# Patient Record
Sex: Female | Born: 1955 | Race: White | Hispanic: No | Marital: Married | Smoking: Former smoker
Health system: Southern US, Community
[De-identification: ages and names within clinical notes are randomized; demographics above are authoritative.]

## PROBLEM LIST (undated history)

## (undated) DIAGNOSIS — F329 Major depressive disorder, single episode, unspecified: Secondary | ICD-10-CM

## (undated) DIAGNOSIS — F32A Depression, unspecified: Secondary | ICD-10-CM

## (undated) HISTORY — PX: ABDOMINAL HYSTERECTOMY: SHX81

## (undated) HISTORY — PX: KNEE SURGERY: SHX244

## (undated) HISTORY — DX: Depression, unspecified: F32.A

## (undated) HISTORY — PX: APPENDECTOMY: SHX54

## (undated) HISTORY — DX: Major depressive disorder, single episode, unspecified: F32.9

## (undated) HISTORY — PX: GASTRIC BYPASS: SHX52

## (undated) HISTORY — PX: ARTHROSCOPIC REPAIR ACL: SUR80

---

## 2012-03-27 ENCOUNTER — Inpatient Hospital Stay (HOSPITAL_COMMUNITY)
Admission: AD | Admit: 2012-03-27 | Discharge: 2012-04-05 | DRG: 087 | Disposition: A | Discharge status: Home / Self Care | Payer: 59 | Source: Other Acute Inpatient Hospital | Attending: General Surgery | Admitting: General Surgery

## 2012-03-27 ENCOUNTER — Encounter (HOSPITAL_COMMUNITY): Payer: Self-pay | Admitting: *Deleted

## 2012-03-27 DIAGNOSIS — S139XXA Sprain of joints and ligaments of unspecified parts of neck, initial encounter: Secondary | ICD-10-CM | POA: Diagnosis present

## 2012-03-27 DIAGNOSIS — S06369A Traumatic hemorrhage of cerebrum, unspecified, with loss of consciousness of unspecified duration, initial encounter: Secondary | ICD-10-CM | POA: Diagnosis present

## 2012-03-27 DIAGNOSIS — Y92009 Unspecified place in unspecified non-institutional (private) residence as the place of occurrence of the external cause: Secondary | ICD-10-CM

## 2012-03-27 DIAGNOSIS — D62 Acute posthemorrhagic anemia: Secondary | ICD-10-CM | POA: Diagnosis present

## 2012-03-27 DIAGNOSIS — S066XAA Traumatic subarachnoid hemorrhage with loss of consciousness status unknown, initial encounter: Principal | ICD-10-CM | POA: Diagnosis present

## 2012-03-27 DIAGNOSIS — Z87820 Personal history of traumatic brain injury: Secondary | ICD-10-CM | POA: Diagnosis present

## 2012-03-27 DIAGNOSIS — S0636AA Traumatic hemorrhage of cerebrum, unspecified, with loss of consciousness status unknown, initial encounter: Secondary | ICD-10-CM | POA: Diagnosis present

## 2012-03-27 DIAGNOSIS — Z880 Allergy status to penicillin: Secondary | ICD-10-CM

## 2012-03-27 DIAGNOSIS — Y998 Other external cause status: Secondary | ICD-10-CM

## 2012-03-27 DIAGNOSIS — W19XXXA Unspecified fall, initial encounter: Secondary | ICD-10-CM | POA: Diagnosis present

## 2012-03-27 DIAGNOSIS — F17200 Nicotine dependence, unspecified, uncomplicated: Secondary | ICD-10-CM | POA: Diagnosis present

## 2012-03-27 DIAGNOSIS — S066X9A Traumatic subarachnoid hemorrhage with loss of consciousness of unspecified duration, initial encounter: Principal | ICD-10-CM | POA: Diagnosis present

## 2012-03-27 MED ORDER — HYDROMORPHONE HCL PF 1 MG/ML IJ SOLN
INTRAMUSCULAR | Status: AC
  Administered 2012-03-27: 1 mg
  Filled 2012-03-27: qty 1

## 2012-03-27 MED ORDER — ONDANSETRON HCL 4 MG/2ML IJ SOLN
4.0000 mg | INTRAMUSCULAR | Status: DC | PRN
  Administered 2012-03-28 – 2012-04-04 (×12): 4 mg via INTRAVENOUS
  Filled 2012-03-27 (×12): qty 2

## 2012-03-27 MED ORDER — ONDANSETRON HCL 4 MG/2ML IJ SOLN
INTRAMUSCULAR | Status: AC
  Administered 2012-03-27: 4 mg
  Filled 2012-03-27: qty 2

## 2012-03-27 MED ORDER — HYDROMORPHONE HCL PF 1 MG/ML IJ SOLN
0.5000 mg | INTRAMUSCULAR | Status: DC | PRN
  Administered 2012-03-28 (×5): 1 mg via INTRAVENOUS
  Filled 2012-03-27 (×5): qty 1

## 2012-03-27 NOTE — Consult Note (Signed)
  Reason for Consult: Closed head injury Referring Physician: Trauma Dr. [name] is an 56 y.o. female.  HPI: Patient is a 56 year old female who fell backwards landing in the back of her head against hardwood floor earlier today she did blackout loose consciousness she does remember the events leading up to the fall and the event since the fall she was taken the [place] emergency department evaluated with a head CT which showed subdural hematoma and right frontal lobe subarachnoid hemorrhage and contusion the patient accepted on trauma and transferred to [facility]. Currently the patient does denies any vision trouble she is nauseated he has been vomiting. She denies any new numbness or tingling or legs other was very difficult to make a with her secondary to her nausea at this point.  No past medical history on file.  No past surgical history on file.  No family history on file.  Social History:  does not have a smoking history on file. She does not have any smokeless tobacco history on file. Her alcohol and drug histories not on file.  Allergies: Allergies not on file  Medications: I have reviewed the patient's current medications.  No results found for this or any previous visit (from the past 48 hour(s)).  No results found.  @ROS @ There were no vitals taken for this visit. Patient is awake alert oriented x3 she was oriented to name place and year but not her age pupils are equal and reactive extraocular movements are intact strength is 5 out of 5 in her upper or lower extremities with no evidence of pronator drift  Assessment/Plan: 56 year female with close injury with small subdural fairly extensive right frontal lobe contusion I do think this is a high-risk to develop any golf and fluff out. So recommend early repeat CT scan earlier this morning and serial CTs of the next 40-72 hours of observation the ICU. Should recede no further expansion of the subdural hematoma did  not think that this would need to be surgically evacuated. Should we develop increased swelling or hemorrhage and a contusion or frontal lobe and increased swelling it is possible that may have to be evacuated. However -- appears to be neurologically fairly intact except for some mild confusion and nausea and vomiting. Cervical spine CT is negative her neck is tender soft left in cervical collar will be ordered an Aspen collar for her.  Jenna Stafford P 03/27/2012, 11:03 PM

## 2012-03-28 ENCOUNTER — Inpatient Hospital Stay (HOSPITAL_COMMUNITY): Payer: 59

## 2012-03-28 ENCOUNTER — Encounter (HOSPITAL_COMMUNITY): Payer: Self-pay | Admitting: Radiology

## 2012-03-28 DIAGNOSIS — S065X9A Traumatic subdural hemorrhage with loss of consciousness of unspecified duration, initial encounter: Secondary | ICD-10-CM

## 2012-03-28 DIAGNOSIS — S139XXA Sprain of joints and ligaments of unspecified parts of neck, initial encounter: Secondary | ICD-10-CM

## 2012-03-28 DIAGNOSIS — S06330A Contusion and laceration of cerebrum, unspecified, without loss of consciousness, initial encounter: Secondary | ICD-10-CM

## 2012-03-28 LAB — CBC
HCT: 31.7 % — ABNORMAL LOW (ref 36.0–46.0)
Hemoglobin: 10.7 g/dL — ABNORMAL LOW (ref 12.0–15.0)
MCH: 29.4 pg (ref 26.0–34.0)
MCHC: 33.8 g/dL (ref 30.0–36.0)
MCV: 87.1 fL (ref 78.0–100.0)
Platelets: 159 10*3/uL (ref 150–400)
RBC: 3.64 MIL/uL — ABNORMAL LOW (ref 3.87–5.11)
RDW: 12.1 % (ref 11.5–15.5)
WBC: 9.2 10*3/uL (ref 4.0–10.5)

## 2012-03-28 LAB — BASIC METABOLIC PANEL
BUN: 18 mg/dL (ref 6–23)
CO2: 27 mEq/L (ref 19–32)
Calcium: 9 mg/dL (ref 8.4–10.5)
Chloride: 103 mEq/L (ref 96–112)
Creatinine, Ser: 0.74 mg/dL (ref 0.50–1.10)
GFR calc Af Amer: >90 mL/min (ref >90)
GFR calc non Af Amer: >90 mL/min (ref >90)
Glucose, Bld: 176 mg/dL — ABNORMAL HIGH (ref 70–99)
Potassium: 4.4 mEq/L (ref 3.5–5.1)
Sodium: 137 mEq/L (ref 135–145)

## 2012-03-28 LAB — MRSA PCR SCREENING: MRSA by PCR: NEGATIVE

## 2012-03-28 MED ORDER — PANTOPRAZOLE SODIUM 40 MG PO TBEC
40.0000 mg | DELAYED_RELEASE_TABLET | Freq: Every day | ORAL | Status: DC
  Administered 2012-03-28: 40 mg via ORAL
  Filled 2012-03-28: qty 1

## 2012-03-28 MED ORDER — KCL IN DEXTROSE-NACL 20-5-0.45 MEQ/L-%-% IV SOLN
INTRAVENOUS | Status: DC
  Administered 2012-03-28: 100 mL/h via INTRAVENOUS
  Filled 2012-03-28 (×3): qty 1000

## 2012-03-28 MED ORDER — PANTOPRAZOLE SODIUM 40 MG IV SOLR
40.0000 mg | Freq: Every day | INTRAVENOUS | Status: DC
  Filled 2012-03-28 (×2): qty 40

## 2012-03-28 MED ORDER — OXYCODONE-ACETAMINOPHEN 5-325 MG PO TABS
1.0000 | ORAL_TABLET | ORAL | Status: DC | PRN
  Administered 2012-03-28 – 2012-03-29 (×4): 2 via ORAL
  Filled 2012-03-28 (×5): qty 2

## 2012-03-28 MED ORDER — MORPHINE SULFATE 2 MG/ML IJ SOLN
2.0000 mg | INTRAMUSCULAR | Status: DC | PRN
  Administered 2012-03-28: 2 mg via INTRAVENOUS
  Administered 2012-03-28: 4 mg via INTRAVENOUS
  Administered 2012-03-28 – 2012-03-29 (×3): 2 mg via INTRAVENOUS
  Filled 2012-03-28 (×2): qty 1
  Filled 2012-03-28: qty 2
  Filled 2012-03-28 (×2): qty 1

## 2012-03-28 MED ORDER — SODIUM CHLORIDE 0.9 % IV SOLN
INTRAVENOUS | Status: DC
  Administered 2012-03-28: 12:00:00 via INTRAVENOUS
  Filled 2012-03-28 (×2): qty 1000

## 2012-03-28 NOTE — H&P (Signed)
Jenna Stafford is an 56 y.o. female.   Chief Complaint: Fall with closed head injury HPI:  Pt is 56 year old female who fell at home yesterday sometime between 1 and 6 pm.  Her husband came home and found her in bed confused with a bump on her posterior head.  He took her to [facility] where she underwent head CT.  At the time, she was alert, but slow to respond to commands and answer questions.  She has been alert here as well, but received pain medication prior to my evaluation.  She is nauseated and photophobic.  She is not able to give me a history of the incident other than nodding to question "Did you fall?"  She is accompanied by her husband.  They live in [place].    History reviewed. No pertinent past medical history.  History reviewed. No pertinent past surgical history.  History reviewed. No pertinent family history. Social History:  reports that she has been smoking Cigarettes.  She has a .5 pack-year smoking history. She does not have any smokeless tobacco history on file. She reports that she does not drink alcohol or use illicit drugs.  Allergies:  Allergies  Allergen Reactions  . Cephalosporins Other (See Comments)  . Ciprofloxacin Other (See Comments)  . Latex Other (See Comments)  . Penicillins Other (See Comments)    No prescriptions prior to admission    No results found for this or any previous visit (from the past 48 hour(s)). No results found.  Review of Systems  Unable to perform ROS: medical condition    Blood pressure 106/62, pulse 76, temperature 98.9 F (37.2 C), temperature source Oral, resp. rate 12, height 5\' 5"  (1.651 m), weight 156 lb 4.9 oz (70.9 kg), SpO2 95.00%. Physical Exam  Constitutional: She appears well-developed and well-nourished. No distress.       Looks uncomfortable.    HENT:  Head: Normocephalic.  Right Ear: External ear normal.  Left Ear: External ear normal.  Nose: Nose normal.  Mouth/Throat: Oropharynx is clear and  moist. No oropharyngeal exudate.       Small posterior hematoma on the occiput  Eyes: Conjunctivae normal are normal. Pupils are equal, round, and reactive to light. Right eye exhibits no discharge. Left eye exhibits no discharge. No scleral icterus.  Neck: Neck supple. No tracheal deviation present. No thyromegaly present.       In cervical collar   Cardiovascular: Normal rate, regular rhythm, normal heart sounds and intact distal pulses.  Exam reveals no gallop and no friction rub.   No murmur heard. Respiratory: Effort normal and breath sounds normal. No respiratory distress. She has no wheezes. She has no rales. She exhibits no tenderness.  GI: Soft. Bowel sounds are normal. She exhibits no distension. There is no tenderness. There is no rebound and no guarding.  Musculoskeletal: Normal range of motion. She exhibits no edema and no tenderness.  Lymphadenopathy:    She has no cervical adenopathy.  Neurological: No cranial nerve deficit. Coordination normal.       Cooperative with basic commands, but very slow to respond, and goes off task easily.  Will answer simple yes no questions.  Eyes closed without constant engagement.  Skin: Skin is dry. No rash noted. She is not diaphoretic. No erythema. No pallor.  Psychiatric:       Unable to assess due to slowing.     Assessment/Plan TBI with SDH/SAH with midline shift secondary to fall Neurosurgery consult Neuro  checks No blood thinning agents. Repeat head CT 3 am per Dr. Wynetta Emery. Will keep cervical collar as mental status insufficient to clear c-spine. NPO for now due to nausea secondary to TBI. Protonix for GI prophylaxis.    Lometa Riggin 03/28/2012, 12:32 AM

## 2012-03-28 NOTE — Progress Notes (Signed)
UR complete 

## 2012-03-28 NOTE — Evaluation (Signed)
Physical Therapy Evaluation Patient Details Name: Tallula Grindle MRN: [id number] DOB: [birth year] Today's Date: 03/28/2012 Time: 1308-6578 PT Time Calculation (min): 20 min  PT Assessment / Plan / Recommendation Clinical Impression  Pt admitted s/p fall with minor TBI and SDH/SAH. Evaluation limited secondary to pt with photosensitivity and extremely lethargic. Pt will benefit from skilled PT in the acute care setting in order to maximize functional mobility and safety prior to d/c home    PT Assessment  Patient needs continued PT services    Follow Up Recommendations  No PT follow up;Supervision for mobility/OOB    Barriers to Discharge        Equipment Recommendations  None recommended by PT    Recommendations for Other Services     Frequency Min 4X/week    Precautions / Restrictions Precautions Precautions: Fall Restrictions Weight Bearing Restrictions: No         Mobility  Bed Mobility Bed Mobility: Supine to Sit;Sitting - Scoot to Edge of Bed Supine to Sit: 4: Min guard;HOB elevated;With rails Sitting - Scoot to Edge of Bed: 4: Min guard Details for Bed Mobility Assistance: VC for sequencing. Slow to complete, although no physical assist neeed Transfers Transfers: Sit to Stand;Stand to Sit Sit to Stand: 4: Min assist;With upper extremity assist;From bed Stand to Sit: 4: Min assist;With upper extremity assist;To chair/3-in-1 Details for Transfer Assistance: VC for hand placement. Min assist for stability as pt slightly weak upon standing. Encouraged slow movements for pt secondary to pain with quick movements Ambulation/Gait Ambulation/Gait Assistance: 4: Min assist Ambulation Distance (Feet): 30 Feet Assistive device: 1 person hand held assist Ambulation/Gait Assistance Details: Min assist for stability as pt extremely lethargic during treatment. Distance limited to in room as pt with increased photo-sensitivity and cannot tolerate outside light Gait Pattern: Within  Functional Limits Gait velocity: slow gait speed    Exercises     PT Diagnosis: Abnormality of gait  PT Problem List: Decreased activity tolerance;Decreased mobility;Decreased knowledge of use of DME;Decreased safety awareness;Decreased knowledge of precautions;Pain PT Treatment Interventions: DME instruction;Gait training;Stair training;Functional mobility training;Therapeutic activities;Patient/family education   PT Goals Acute Rehab PT Goals PT Goal Formulation: With patient/family Time For Goal Achievement: 04/11/12 Potential to Achieve Goals: Fair Pt will go Supine/Side to Sit: with modified independence PT Goal: Supine/Side to Sit - Progress: Goal set today Pt will go Sit to Supine/Side: with modified independence PT Goal: Sit to Supine/Side - Progress: Goal set today Pt will go Sit to Stand: with modified independence PT Goal: Sit to Stand - Progress: Goal set today Pt will go Stand to Sit: with modified independence PT Goal: Stand to Sit - Progress: Goal set today Pt will Transfer Bed to Chair/Chair to Bed: with modified independence PT Transfer Goal: Bed to Chair/Chair to Bed - Progress: Goal set today Pt will Ambulate: >150 feet;with supervision;with least restrictive assistive device PT Goal: Ambulate - Progress: Goal set today Pt will Go Up / Down Stairs: Flight;with supervision;with rail(s) PT Goal: Up/Down Stairs - Progress: Goal set today  Visit Information  Last PT Received On: 03/28/12 Assistance Needed: +1    Subjective Data      Prior Functioning  Home Living Lives With: Spouse Available Help at Discharge: Family;Other (Comment) (husband works 12 hr shifts, 3-4 days a week) Type of Home: House Home Access: Stairs to enter [employer] of Steps: 10 Entrance Stairs-Rails: Can reach both;Right;Left Home Layout: One level Bathroom Shower/Tub: Walk-in shower;Door [employer] Toilet: Standard Bathroom Accessibility: Yes How Accessible: Accessible  via walker Home Adaptive Equipment: Crutches Prior Function Level of Independence: Independent Able to Take Stairs?: Yes Driving: Yes Vocation: Full time employment 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 Communication Communication: No difficulties Dominant Hand: Right    Cognition  Overall Cognitive Status: Appears within functional limits for tasks assessed/performed Arousal/Alertness: Lethargic Orientation Level: Appears intact for tasks assessed Behavior During Session: Lethargic    Extremity/Trunk Assessment Right Lower Extremity Assessment RLE ROM/Strength/Tone: Within functional levels RLE Sensation: WFL - Light Touch Left Lower Extremity Assessment LLE ROM/Strength/Tone: Within functional levels LLE Sensation: WFL - Light Touch   Balance    End of Session PT - End of Session Equipment Utilized During Treatment: Gait belt Activity Tolerance: Patient limited by fatigue Patient left: in chair;with call bell/phone within reach;with family/visitor present Nurse Communication: Mobility status    Milana Kidney 03/28/2012, 5:32 PM  03/28/2012 Milana Kidney DPT PAGER: [phone] OFFICE: [phone]

## 2012-03-28 NOTE — Progress Notes (Signed)
Subjective: Patient reports She feels better this morning she's having a headache but denies is improved. She denies any vision changes numbness tingling arms or legs.  Objective: Vital signs in last 24 hours: Temp:  [98.3 F (36.8 C)-98.9 F (37.2 C)] 98.9 F (37.2 C) (09/12 0900) Pulse Rate:  [65-91] 65  (09/12 0900) Resp:  [12-26] 15  (09/12 0900) BP: (105-139)/(51-70) 120/56 mmHg (09/12 0900) SpO2:  [94 %-97 %] 94 % (09/12 0900) Weight:  [70.9 kg (156 lb 4.9 oz)] 70.9 kg (156 lb 4.9 oz) (09/11 2300)  Intake/Output from previous day: 09/11 0701 - 09/12 0700 In: 600 [I.V.:600] Out: 3 [Emesis/NG output:3] Intake/Output this shift: Total I/O In: 100 [I.V.:100] Out: -   Awake alert oriented strength out of 5  Lab Results:  [facility] 03/28/12 0420  WBC 9.2  HGB 10.7*  HCT 31.7*  PLT 159   BMET  Basename 03/28/12 0420  NA 137  K 4.4  CL 103  CO2 27  GLUCOSE 176*  BUN 18  CREATININE 0.74  CALCIUM 9.0    Studies/Results: Ct Head Wo Contrast  03/28/2012  *RADIOLOGY REPORT*  Clinical Data: Follow-up subdural and subarachnoid hemorrhage.  CT HEAD WITHOUT CONTRAST  Technique:  Contiguous axial images were obtained from the base of the skull through the vertex without contrast.  Comparison: 03/27/2012  Findings: Increasing focal intraparenchymal hematoma in the right anterior frontal lobe, now measuring 2.2 cm diameter.  There is edema surrounding this hematoma.  Again demonstrated is subarachnoid hemorrhage in the right frontal and temporal sulci and along the anterior falx.  Small right frontoparietal subdural hematoma also appears stable.  Minimal right to left midline shift of about 2 mm.  Sulci effacement on the right.  No intraventricular hemorrhage.  No depressed skull fractures.  IMPRESSION: Stable appearance of right-sided sulcal subarachnoid hemorrhage and small right subdural hemorrhage.  Focal increasing right anterior frontal intraparenchymal hemorrhage now measures  about 2.2 cm diameter.   Original Report Authenticated By: Marlon Pel, M.D.     Assessment/Plan: Continue observation in the ICU head CT in the morning mobilization with physical therapy.  LOS: 1 day     Bray Vickerman P 03/28/2012, 9:39 AM

## 2012-03-28 NOTE — Progress Notes (Signed)
Patient ID: Jenna Stafford, female   DOB: July 11, 1956, 56 y.o.   MRN: [id number]    Subjective: Posterior neck pain, could not void on bedpan  Objective: Vital signs in last 24 hours: Temp:  [98.3 F (36.8 C)-98.9 F (37.2 C)] 98.9 F (37.2 C) (09/12 0900) Pulse Rate:  [65-91] 65  (09/12 1000) Resp:  [12-26] 13  (09/12 1000) BP: (105-139)/(51-70) 112/52 mmHg (09/12 1000) SpO2:  [94 %-97 %] 95 % (09/12 1000) Weight:  [70.9 kg (156 lb 4.9 oz)] 70.9 kg (156 lb 4.9 oz) (09/11 2300)    Intake/Output from previous day: 09/11 0701 - 09/12 0700 In: 602 [I.V.:600; IV Piggyback:2] Out: 3 [Emesis/NG output:3] Intake/Output this shift: Total I/O In: 302 [I.V.:300; IV Piggyback:2] Out: -   General appearance: alert and cooperative Neck: collar on, +post midline tenderness Resp: clear to auscultation bilaterally Cardio: regular rate and rhythm GI: soft, NT, +BS Neuro: arouses easily, F/C, MAE  Lab Results: CBC   Basename 03/28/12 0420  WBC 9.2  HGB 10.7*  HCT 31.7*  PLT 159   BMET  Basename 03/28/12 0420  NA 137  K 4.4  CL 103  CO2 27  GLUCOSE 176*  BUN 18  CREATININE 0.74  CALCIUM 9.0   PT/INR No results found for this basename: LABPROT:2,INR:2 in the last 72 hours ABG No results found for this basename: PHART:2,PCO2:2,PO2:2,HCO3:2 in the last 72 hours  Studies/Results: Ct Head Wo Contrast  03/28/2012  *RADIOLOGY REPORT*  Clinical Data: Follow-up subdural and subarachnoid hemorrhage.  CT HEAD WITHOUT CONTRAST  Technique:  Contiguous axial images were obtained from the base of the skull through the vertex without contrast.  Comparison: 03/27/2012  Findings: Increasing focal intraparenchymal hematoma in the right anterior frontal lobe, now measuring 2.2 cm diameter.  There is edema surrounding this hematoma.  Again demonstrated is subarachnoid hemorrhage in the right frontal and temporal sulci and along the anterior falx.  Small right frontoparietal subdural hematoma also  appears stable.  Minimal right to left midline shift of about 2 mm.  Sulci effacement on the right.  No intraventricular hemorrhage.  No depressed skull fractures.  IMPRESSION: Stable appearance of right-sided sulcal subarachnoid hemorrhage and small right subdural hemorrhage.  Focal increasing right anterior frontal intraparenchymal hemorrhage now measures about 2.2 cm diameter.   Original Report Authenticated By: Marlon Pel, M.D.     Anti-infectives: Anti-infectives    None      Assessment/Plan: Fall TBI/SDH/ R frontal ICC - SDH stable, ICC larger, watch in 3100 per Dr. Wynetta Emery but ok to mobilize Cervical strain - check flex/ext FEN - start PO, change IVF VTE - PAS PT/OT   LOS: 1 day    Violeta Gelinas, MD, MPH, FACS Pager: [phone]  03/28/2012

## 2012-03-29 ENCOUNTER — Inpatient Hospital Stay (HOSPITAL_COMMUNITY): Payer: 59

## 2012-03-29 DIAGNOSIS — D62 Acute posthemorrhagic anemia: Secondary | ICD-10-CM | POA: Diagnosis present

## 2012-03-29 DIAGNOSIS — S06369A Traumatic hemorrhage of cerebrum, unspecified, with loss of consciousness of unspecified duration, initial encounter: Secondary | ICD-10-CM | POA: Diagnosis present

## 2012-03-29 DIAGNOSIS — W19XXXA Unspecified fall, initial encounter: Secondary | ICD-10-CM | POA: Diagnosis present

## 2012-03-29 DIAGNOSIS — Z720 Tobacco use: Secondary | ICD-10-CM | POA: Insufficient documentation

## 2012-03-29 DIAGNOSIS — F329 Major depressive disorder, single episode, unspecified: Secondary | ICD-10-CM | POA: Insufficient documentation

## 2012-03-29 DIAGNOSIS — F32A Depression, unspecified: Secondary | ICD-10-CM | POA: Insufficient documentation

## 2012-03-29 DIAGNOSIS — Z87820 Personal history of traumatic brain injury: Secondary | ICD-10-CM | POA: Diagnosis present

## 2012-03-29 DIAGNOSIS — G43909 Migraine, unspecified, not intractable, without status migrainosus: Secondary | ICD-10-CM | POA: Insufficient documentation

## 2012-03-29 LAB — BASIC METABOLIC PANEL
BUN: 15 mg/dL (ref 6–23)
CO2: 27 mEq/L (ref 19–32)
Calcium: 9.5 mg/dL (ref 8.4–10.5)
Chloride: 104 mEq/L (ref 96–112)
Creatinine, Ser: 0.71 mg/dL (ref 0.50–1.10)
GFR calc Af Amer: >90 mL/min (ref >90)
GFR calc non Af Amer: >90 mL/min (ref >90)
Glucose, Bld: 107 mg/dL — ABNORMAL HIGH (ref 70–99)
Potassium: 4.1 mEq/L (ref 3.5–5.1)
Sodium: 139 mEq/L (ref 135–145)

## 2012-03-29 MED ORDER — TRAMADOL HCL 50 MG PO TABS
100.0000 mg | ORAL_TABLET | Freq: Four times a day (QID) | ORAL | Status: DC
  Administered 2012-03-29 – 2012-04-04 (×21): 100 mg via ORAL
  Filled 2012-03-29 (×24): qty 2
  Filled 2012-03-29: qty 1
  Filled 2012-03-29 (×4): qty 2
  Filled 2012-03-29: qty 1
  Filled 2012-03-29 (×7): qty 2

## 2012-03-29 MED ORDER — CITALOPRAM HYDROBROMIDE 40 MG PO TABS
40.0000 mg | ORAL_TABLET | Freq: Every day | ORAL | Status: DC
Start: 2012-03-29 — End: 2012-04-05
  Administered 2012-03-29 – 2012-04-05 (×8): 40 mg via ORAL
  Filled 2012-03-29 (×8): qty 1

## 2012-03-29 MED ORDER — OXYCODONE HCL 5 MG PO TABS
10.0000 mg | ORAL_TABLET | ORAL | Status: DC | PRN
  Administered 2012-03-29 – 2012-03-30 (×8): 20 mg via ORAL
  Administered 2012-03-31: 10 mg via ORAL
  Administered 2012-03-31 – 2012-04-04 (×14): 20 mg via ORAL
  Administered 2012-04-04: 10 mg via ORAL
  Administered 2012-04-04 – 2012-04-05 (×3): 20 mg via ORAL
  Administered 2012-04-05: 10 mg via ORAL
  Filled 2012-03-29 (×2): qty 4
  Filled 2012-03-29: qty 2
  Filled 2012-03-29 (×8): qty 4
  Filled 2012-03-29 (×2): qty 2
  Filled 2012-03-29 (×7): qty 4
  Filled 2012-03-29: qty 2
  Filled 2012-03-29 (×8): qty 4

## 2012-03-29 MED ORDER — SUMATRIPTAN SUCCINATE 100 MG PO TABS
100.0000 mg | ORAL_TABLET | ORAL | Status: DC | PRN
  Administered 2012-03-29 – 2012-03-30 (×3): 100 mg via ORAL
  Administered 2012-03-31: 50 mg via ORAL
  Administered 2012-03-31: 100 mg via ORAL
  Filled 2012-03-29 (×5): qty 1

## 2012-03-29 MED ORDER — MORPHINE SULFATE 4 MG/ML IJ SOLN
4.0000 mg | INTRAMUSCULAR | Status: DC | PRN
  Administered 2012-04-01 – 2012-04-05 (×14): 4 mg via INTRAVENOUS
  Filled 2012-03-29 (×15): qty 1

## 2012-03-29 NOTE — Progress Notes (Signed)
Subjective: Patient reports She's feeling better so a severe headache with nausea and vomiting significantly improved  Objective: Vital signs in last 24 hours: Temp:  [98 F (36.7 C)-99.2 F (37.3 C)] 98.9 F (37.2 C) (09/13 0400) Pulse Rate:  [57-72] 57  (09/13 0700) Resp:  [12-22] 15  (09/13 0700) BP: (97-144)/(50-64) 117/62 mmHg (09/13 0700) SpO2:  [94 %-97 %] 96 % (09/13 0700)  Intake/Output from previous day: 09/12 0701 - 09/13 0700 In: 1564 [P.O.:200; I.V.:1360; IV Piggyback:4] Out: 1050 [Urine:1050] Intake/Output this shift:    Awake alert oriented strength out of 5 no pronator drift pupils are equal round and reactive to light  Lab Results:  [facility] 03/28/12 0420  WBC 9.2  HGB 10.7*  HCT 31.7*  PLT 159   BMET  Basename 03/29/12 0501 03/28/12 0420  NA 139 137  K 4.1 4.4  CL 104 103  CO2 27 27  GLUCOSE 107* 176*  BUN 15 18  CREATININE 0.71 0.74  CALCIUM 9.5 9.0    Studies/Results: Ct Head Wo Contrast  03/28/2012  *RADIOLOGY REPORT*  Clinical Data: Follow-up subdural and subarachnoid hemorrhage.  CT HEAD WITHOUT CONTRAST  Technique:  Contiguous axial images were obtained from the base of the skull through the vertex without contrast.  Comparison: 03/27/2012  Findings: Increasing focal intraparenchymal hematoma in the right anterior frontal lobe, now measuring 2.2 cm diameter.  There is edema surrounding this hematoma.  Again demonstrated is subarachnoid hemorrhage in the right frontal and temporal sulci and along the anterior falx.  Small right frontoparietal subdural hematoma also appears stable.  Minimal right to left midline shift of about 2 mm.  Sulci effacement on the right.  No intraventricular hemorrhage.  No depressed skull fractures.  IMPRESSION: Stable appearance of right-sided sulcal subarachnoid hemorrhage and small right subdural hemorrhage.  Focal increasing right anterior frontal intraparenchymal hemorrhage now measures about 2.2 cm diameter.    Original Report Authenticated By: Marlon Pel, M.D.    Dg Cerv Spine Flex&ext Only  03/28/2012  *RADIOLOGY REPORT*  Clinical Data: 56 year old female status post fall with neck pain.  CERVICAL SPINE - FLEXION AND EXTENSION VIEWS ONLY  Comparison: [facility] 03/27/2012 head and cervical spine CT.  Findings: Lateral views in flexion and extension.  In extension prevertebral soft tissue contours are stable within normal limits.  Straightening of cervical lordosis is stable. Cervicothoracic junction alignment is within normal limits.  Little if any range of motion from the presumably neutral position seen on the comparison.  In flexion there is mild reversal of cervical lordosis. Prevertebral soft tissue contours remain normal.  No abnormal motion is identified. Cervicothoracic junction alignment is within normal limits.  IMPRESSION: Decreased range of motion and persistent straightening of cervical lordosis, but no abnormal motion to suggest instability.   Original Report Authenticated By: Harley Hallmark, M.D.     Assessment/Plan: Postop day 2 from admission for management of subdural and right frontal hemorrhagic contusion patient seems to be recovering well CAT scan is improved with some involution of the hemorrhage and a mild amount of vasogenic edema minimal midline shift I think it's okay the patient be transferred to the floor when observed for another 24-48 hours and repeat a CT scan in 48 hours. If that CT scan stable and the patient clinically improved is okay the patient discharged from my perspective.  LOS: 2 days     Jenna Stafford P 03/29/2012, 7:50 AM

## 2012-03-29 NOTE — Progress Notes (Signed)
Doing better.  Plan to transfer to floor and continue therapies. Patient examined and I agree with the assessment and plan  Violeta Gelinas, MD, MPH, FACS Pager: [phone]  03/29/2012 9:55 AM

## 2012-03-29 NOTE — Evaluation (Signed)
Occupational Therapy Evaluation Patient Details Name: Jenna Stafford MRN: [id number] DOB: 05/24/1956 Today's Date: 03/29/2012 Time: 9147-8295 OT Time Calculation (min): 26 min  OT Assessment / Plan / Recommendation Clinical Impression  56 yo female s/p CHI TBI with SDH/ R frontal ICC that does not require skilled OT acutely. Recommend Outpatient for balance and neck exercises.    OT Assessment  Patient does not need any further OT services    Follow Up Recommendations       Barriers to Discharge      Equipment Recommendations       Recommendations for Other Services    Frequency       Precautions / Restrictions Precautions Precautions: Fall Restrictions Weight Bearing Restrictions: No   Pertinent Vitals/Pain Neck pain only with flexion/ extension (looking down)    ADL  Eating/Feeding: Simulated;Independent Where Assessed - Eating/Feeding: Chair Grooming: Performed;Wash/dry face;Wash/dry hands;Independent Where Assessed - Grooming: Unsupported standing Lower Body Dressing: Performed;Modified independent Where Assessed - Lower Body Dressing: Supported sitting ADL Comments: Pt educated on brain injury and the need to allow quiet time for the brain to recover. Pt educated on safety concerns and doctor clearance to return to work. Pt with neck pain with flexion / extension and describes as Fluids moving. Pt educated on allowing prolonged / extended time for the brain to recover and not recommending return to work at normal full capacity immediately. Pt agreeable and states "it makes sense." Pt educated on not driving until medically cleared by MD. Pt educated on taking a warm bath the first time and family being present to (A) for safety. pt educated on high risk for second injury if returning to work or normal activity too soon.     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   Prior Functioning  Vision/Perception  Home Living Lives With: Spouse Available Help at Discharge: Family;Available PRN/intermittently Type of Home: House Home Access: Stairs to enter [employer] of Steps: 10 Entrance Stairs-Rails: Can reach both;Right;Left Home Layout: One level Bathroom Shower/Tub: Walk-in shower;Door [employer] Toilet: Pharmacist, community: Yes How Accessible: Accessible via walker Home Adaptive Equipment: Crutches Prior Function Level of Independence: Independent Able to Take Stairs?: Yes Driving: Yes 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 Communication Communication: No difficulties Dominant Hand: Right      Cognition  Overall Cognitive Status: Appears within functional limits for tasks assessed/performed Arousal/Alertness: Awake/alert Orientation Level: Appears intact for tasks assessed Behavior During Session: [facility] for tasks performed Cognition - Other Comments: question higher level cognitive challenges due to patient stating 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    Extremity/Trunk Assessment Right Upper Extremity Assessment RUE ROM/Strength/Tone: Within functional levels RUE Coordination: WFL - gross/fine motor Left Upper Extremity Assessment LUE ROM/Strength/Tone: Within functional levels LUE Coordination: WFL - gross/fine motor Trunk Assessment Trunk Assessment: Normal   Mobility  Shoulder Instructions  Transfers Sit to Stand: 5: Supervision;With upper extremity assist;From bed Stand to Sit: 5: Supervision;With upper extremity assist;To chair/3-in-1       Exercise     Balance     End of Session OT -  End of Session Activity Tolerance: Patient tolerated treatment well Patient left: in  chair;with call bell/phone within reach Nurse Communication: Mobility status  GO     Harrel [facility] 03/29/2012, 2:41 PM Pager: [phone]

## 2012-03-29 NOTE — Evaluation (Signed)
Speech Language Pathology Evaluation Patient Details Name: Jenna Stafford MRN: [id number] DOB: Sep 26, 1955 Today's Date: 03/29/2012 Time: 1100-1140 SLP Time Calculation (min): 40 min  Problem List:  Patient Active Problem List  Diagnosis  . Fall  . Traumatic subdural hematoma  . Traumatic intracerebral hemorrhage  . Acute blood loss anemia  . Migraine  . Depression  . Tobacco use   Past Medical History: History reviewed. No pertinent past medical history. Past Surgical History: History reviewed. No pertinent past surgical history. HPI:  HPI: Patient is a 56 year old female who fell backwards landing in the back of her head against hardwood floor earlier today she did blackout loose consciousness she does remember the events leading up to the fall and the event since the fall she was taken the [place] emergency department evaluated with a head CT which showed subdural hematoma and right frontal lobe subarachnoid hemorrhage and contusion the patient accepted on trauma and transferred to [facility]. Currently the patient does denies any vision trouble she is nauseated he has been vomiting. She denies any new numbness or tingling or legs other was very difficult to make a with her secondary to her nausea at this point.  Patient referred for Cognitive Linguistic evaluation per stroke protocol.     Assessment / Plan / Recommendation Clinical Impression   Moderate cognitive defict in areas of emergent and anticipatory awareness of deficits, complex problem solving, and excecutive functions. No Skilled ST treatment warranted in acute care setting as patient receiving necessary supervision.  ST to sign off as education completed.  Recommend Cognitive Linguistic Evaluation in area of executive function in Out Patient setting to ensure safe return to home environment and vocation.     SLP Assessment  All further Speech Lanaguage Pathology  needs can be addressed in the next venue of care    Follow Up  Recommendations  Outpatient SLP           SLP Evaluation Prior Functioning  Cognitive/Linguistic Baseline: Information not available Type of Home: House Lives With: Spouse Available Help at Discharge: Family;Available PRN/intermittently Education: RN Vocation: Full time employment   Cognition  Overall Cognitive Status: Impaired Arousal/Alertness: Awake/alert Orientation Level: Oriented X4 Attention: Sustained Sustained Attention: Impaired Sustained Attention Impairment: Verbal complex;Functional complex Memory: Impaired Memory Impairment: Retrieval deficit Awareness: Impaired Awareness Impairment: Emergent impairment;Anticipatory impairment Problem Solving: Impaired Problem Solving Impairment: Verbal complex;Functional complex Executive Function: Reasoning Reasoning: Impaired Reasoning Impairment: Verbal complex;Functional complex Safety/Judgment: Impaired    Comprehension  Auditory Comprehension Overall Auditory Comprehension: Appears within functional limits for tasks assessed    Expression Verbal Expression Overall Verbal Expression: Appears within functional limits for tasks assessed Written Expression Dominant Hand: Right Written Expression: Not tested   Oral / Motor Oral Motor/Sensory Function Overall Oral Motor/Sensory Function: Appears within functional limits for tasks assessed       Moreen Fowler MS, CCC-SLP [phone] [facility] 03/29/2012, 12:13 PM

## 2012-03-29 NOTE — Progress Notes (Signed)
Physical Therapy Treatment Patient Details Name: Jenna Stafford MRN: [id number] DOB: April 03, 1956 Today's Date: 03/29/2012 Time: 7829-5621 PT Time Calculation (min): 19 min  PT Assessment / Plan / Recommendation Comments on Treatment Session  Pt is progressing well,  will not need follow up once home.    Follow Up Recommendations  No PT follow up;Supervision for mobility/OOB    Barriers to Discharge        Equipment Recommendations  None recommended by PT    Recommendations for Other Services    Frequency     Plan Discharge plan remains appropriate;Frequency remains appropriate    Precautions / Restrictions Precautions Precautions: Fall (minimal risk) Restrictions Weight Bearing Restrictions: No   Pertinent Vitals/Pain     Mobility  Bed Mobility Bed Mobility: Supine to Sit;Sitting - Scoot to Edge of Bed Supine to Sit: 5: Supervision Sitting - Scoot to Edge of Bed: 5: Supervision Details for Bed Mobility Assistance: moved safely Transfers Transfers: Sit to Stand;Stand to Sit Sit to Stand: 5: Supervision;With upper extremity assist;From bed Stand to Sit: 5: Supervision;With upper extremity assist;To chair/3-in-1 Details for Transfer Assistance: safe technique Ambulation/Gait Ambulation/Gait Assistance: 5: Supervision Ambulation Distance (Feet): 200 Feet Assistive device: None Ambulation/Gait Assistance Details: Generally steady without assist device.  Does not scan her environment well due to it making her dizzy. Gait Pattern: Within Functional Limits Gait velocity: slow gait speed (but can change cadence if cued) Stairs: Yes Stairs Assistance: 5: Supervision Stairs Assistance Details (indicate cue type and reason): slow and deliberate but safe technique Stair Management Technique: One rail Right;Alternating pattern;Step to pattern;Forwards Number of Stairs: 12  Wheelchair Mobility Wheelchair Mobility: No Modified Rankin (Stroke Patients Only) Pre-Morbid Rankin  Score: No symptoms Modified Rankin: Moderate disability    Exercises     PT Diagnosis:    PT Problem List:   PT Treatment Interventions:     PT Goals Acute Rehab PT Goals Potential to Achieve Goals: Good PT Goal: Supine/Side to Sit - Progress: Progressing toward goal PT Goal: Sit to Supine/Side - Progress: Progressing toward goal PT Goal: Sit to Stand - Progress: Progressing toward goal PT Goal: Stand to Sit - Progress: Progressing toward goal PT Transfer Goal: Bed to Chair/Chair to Bed - Progress: Progressing toward goal PT Goal: Ambulate - Progress: Met PT Goal: Up/Down Stairs - Progress: Met  Visit Information  Last PT Received On: 03/29/12 Assistance Needed: +1    Subjective Data  Subjective: I'm feeling better today than other days, my head just hurts   Cognition  Overall Cognitive Status: Appears within functional limits for tasks assessed/performed Arousal/Alertness: Awake/alert Orientation Level: Appears intact for tasks assessed Behavior During Session: [facility] for tasks performed    Balance  Balance Balance Assessed: Yes Dynamic Standing Balance Dynamic Standing - Balance Support: No upper extremity supported;During functional activity Dynamic Standing - Level of Assistance: 5: Stand by assistance  End of Session PT - End of Session Activity Tolerance: Patient tolerated treatment well Patient left: in chair;with call bell/phone within reach;with family/visitor present Nurse Communication: Mobility status   GP     Johanna Matto, Eliseo Gum 03/29/2012, 10:25 AM  03/29/2012  [place]arthage Bing, PT [phone] (pager)

## 2012-03-29 NOTE — Progress Notes (Signed)
Received patient from 3100, arrived via wheelchair. No complaints voiced.  Sitting in chair, eating lunch.  Pt oriented to unit, and signed fall and safety paper.  Reminded to call for assistance when wants to get out of chair.

## 2012-03-29 NOTE — Progress Notes (Signed)
Patient ID: Jenna Stafford, female   DOB: 02-08-1956, 56 y.o.   MRN: [id number]   LOS: 2 days   Subjective: Feeling a little better this am, no emesis. Still has severe HA, +photophobia. Long hx/o migraine.  Objective: Vital signs in last 24 hours: Temp:  [98 F (36.7 C)-99.2 F (37.3 C)] 98.9 F (37.2 C) (09/13 0400) Pulse Rate:  [57-72] 57  (09/13 0700) Resp:  [12-22] 15  (09/13 0700) BP: (97-144)/(50-64) 117/62 mmHg (09/13 0700) SpO2:  [95 %-97 %] 96 % (09/13 0700)    Lab Results:  BMET  Basename 03/29/12 0501 03/28/12 0420  NA 139 137  K 4.1 4.4  CL 104 103  CO2 27 27  GLUCOSE 107* 176*  BUN 15 18  CREATININE 0.71 0.74  CALCIUM 9.5 9.0    Radiology CT HEAD WITHOUT CONTRAST  Technique: Contiguous axial images were obtained from the base of  the skull through the vertex without contrast.  Comparison: 03/28/2012 and 03/27/2012.  Findings: Broad-based right-sided subdural hematoma greatest  laterally and anteriorly with maximal thickness of 4.3 mm without  significant change. Local mass effect upon the right lateral  ventricle with 2.3 mm midline shift to the left stable. Small  amount of subarachnoid blood without significant change.  Anterior right frontal parenchymal hematoma has increased in size  as has the amount of surrounding vasogenic edema. Maximal  transverse dimension 2.4 x 2.3 cm versus prior 2.2 x 2.1 cm.  No CT evidence of large acute thrombotic infarct. No intracranial  mass lesions seen separate from the above described findings.  No skull fracture detected.  IMPRESSION:  Anterior right frontal parenchymal hematoma has increased in size  as has the amount of surrounding vasogenic edema. Maximal  transverse dimension 2.4 x 2.3 cm versus prior 2.2 x 2.1 cm.  Broad-based right-sided subdural hematoma greatest laterally and  anteriorly with maximal thickness of 4.3 mm without significant  change.  Local mass effect upon the right lateral ventricle  with 2.3 mm  midline shift to the left stable.  Small amount of subarachnoid blood without significant change.  This has been made a PRA call report utilizing dashboard call  feature.  Original Report Authenticated By: Fuller Canada, M.D.   General appearance: alert and mild distress Resp: clear to auscultation bilaterally Cardio: regular rate and rhythm GI: normal findings: bowel sounds normal and soft, non-tender   Assessment/Plan: Fall  TBI/SDH/ R frontal ICC - HCT stable. Dr. Wynetta Emery ok to transfer to floor, plan on HCT in 48h. Will give Maxalt as HA might have migrainous component. Add tramadol as oxycodone not controlling pain without IV breakthrough meds. Cervical strain  FEN - No issues VTE - PAS  Dispo -- To floor.     Freeman Caldron, PA-C Pager: [phone] General Trauma PA Pager: [phone]   03/29/2012

## 2012-03-30 MED ORDER — NICOTINE 21 MG/24HR TD PT24
21.0000 mg | MEDICATED_PATCH | Freq: Every day | TRANSDERMAL | Status: DC
  Administered 2012-03-30 – 2012-04-05 (×7): 21 mg via TRANSDERMAL
  Filled 2012-03-30 (×8): qty 1

## 2012-03-30 MED ORDER — WHITE PETROLATUM GEL
Status: AC
  Administered 2012-03-30: 10:00:00
  Filled 2012-03-30: qty 5

## 2012-03-30 NOTE — Progress Notes (Signed)
Subjective: Pt with con't h/a.  Pt on imitrex with some help.  CTH in AM  Objective: Vital signs in last 24 hours: Temp:  [97.9 F (36.6 C)-98.3 F (36.8 C)] 97.9 F (36.6 C) (09/14 0655) Pulse Rate:  [53-60] 58  (09/14 0655) Resp:  [11-20] 18  (09/14 0655) BP: (125-141)/(55-66) 133/64 mmHg (09/14 0655) SpO2:  [96 %-100 %] 99 % (09/14 0655)    Intake/Output from previous day: 09/13 0701 - 09/14 0700 In: 50 [P.O.:50] Out: 200 [Urine:200] Intake/Output this shift:    General appearance: alert and cooperative Head: Normocephalic, without obvious abnormality, atraumatic, [place]/AT  Lab Results:   [facility] 03/28/12 0420  WBC 9.2  HGB 10.7*  HCT 31.7*  PLT 159   BMET  Basename 03/29/12 0501 03/28/12 0420  NA 139 137  K 4.1 4.4  CL 104 103  CO2 27 27  GLUCOSE 107* 176*  BUN 15 18  CREATININE 0.71 0.74  CALCIUM 9.5 9.0   PT/INR No results found for this basename: LABPROT:2,INR:2 in the last 72 hours ABG No results found for this basename: PHART:2,PCO2:2,PO2:2,HCO3:2 in the last 72 hours  Studies/Results: Ct Head Wo Contrast  03/29/2012  *RADIOLOGY REPORT*  Clinical Data: Severe headaches post fall.  Closed head injury.  CT HEAD WITHOUT CONTRAST  Technique:  Contiguous axial images were obtained from the base of the skull through the vertex without contrast.  Comparison: 03/28/2012 and 03/27/2012.  Findings: Broad-based right-sided subdural hematoma greatest laterally and anteriorly with maximal thickness of 4.3 mm without significant change.  Local mass effect upon the right lateral ventricle with 2.3 mm midline shift to the left stable.  Small amount of subarachnoid blood without significant change.  Anterior right frontal parenchymal hematoma has increased in size as has the amount of surrounding vasogenic edema.  Maximal transverse dimension 2.4 x 2.3 cm versus prior 2.2 x 2.1 cm.  No CT evidence of large acute thrombotic infarct.  No intracranial mass lesions seen  separate from the above described findings.  No skull fracture detected.  IMPRESSION: Anterior right frontal parenchymal hematoma has increased in size as has the amount of surrounding vasogenic edema.  Maximal transverse dimension 2.4 x 2.3 cm versus prior 2.2 x 2.1 cm.  Broad-based right-sided subdural hematoma greatest laterally and anteriorly with maximal thickness of 4.3 mm without significant change.  Local mass effect upon the right lateral ventricle with 2.3 mm midline shift to the left stable.  Small amount of subarachnoid blood without significant change.  This has been made a PRA call report utilizing dashboard call feature.   Original Report Authenticated By: Fuller Canada, M.D.    Dg Cerv Spine Flex&ext Only  03/28/2012  *RADIOLOGY REPORT*  Clinical Data: 56 year old female status post fall with neck pain.  CERVICAL SPINE - FLEXION AND EXTENSION VIEWS ONLY  Comparison: [facility] 03/27/2012 head and cervical spine CT.  Findings: Lateral views in flexion and extension.  In extension prevertebral soft tissue contours are stable within normal limits.  Straightening of cervical lordosis is stable. Cervicothoracic junction alignment is within normal limits.  Little if any range of motion from the presumably neutral position seen on the comparison.  In flexion there is mild reversal of cervical lordosis. Prevertebral soft tissue contours remain normal.  No abnormal motion is identified. Cervicothoracic junction alignment is within normal limits.  IMPRESSION: Decreased range of motion and persistent straightening of cervical lordosis, but no abnormal motion to suggest instability.   Original Report Authenticated By: Ulla Potash  III, M.D.     Anti-infectives: Anti-infectives    None      Assessment/Plan: Fall  TBI/SDH/ R frontal ICC -Con't with Imitrex for h/a; repeat CT-H on Sunday AM Cervical strain  FEN - No issues  VTE - PAS  Dispo -- Home Sunday if CT-H ok    LOS: 3 days     Marigene Ehlers., [facility] 03/30/2012

## 2012-03-30 NOTE — Progress Notes (Signed)
Patient ID: Jenna Stafford, female   DOB: 1956/04/10, 56 y.o.   MRN: [id number] Subjective:  The patient is alert and pleasant. She complains of a headache.  Objective: Vital signs in last 24 hours: Temp:  [97.9 F (36.6 C)-98.3 F (36.8 C)] 97.9 F (36.6 C) (09/14 0655) Pulse Rate:  [53-63] 58  (09/14 0655) Resp:  [11-20] 18  (09/14 0655) BP: (116-141)/(55-66) 133/64 mmHg (09/14 0655) SpO2:  [93 %-100 %] 99 % (09/14 0655)  Intake/Output from previous day: 09/13 0701 - 09/14 0700 In: 50 [P.O.:50] Out: 200 [Urine:200] Intake/Output this shift:    Physical exam patient is alert and oriented. She is moving all 4 extremities well. Her speech is normal. Her pupils are equal.  Lab Results:  [facility] 03/28/12 0420  WBC 9.2  HGB 10.7*  HCT 31.7*  PLT 159   BMET  Basename 03/29/12 0501 03/28/12 0420  NA 139 137  K 4.1 4.4  CL 104 103  CO2 27 27  GLUCOSE 107* 176*  BUN 15 18  CREATININE 0.71 0.74  CALCIUM 9.5 9.0    Studies/Results: Ct Head Wo Contrast  03/29/2012  *RADIOLOGY REPORT*  Clinical Data: Severe headaches post fall.  Closed head injury.  CT HEAD WITHOUT CONTRAST  Technique:  Contiguous axial images were obtained from the base of the skull through the vertex without contrast.  Comparison: 03/28/2012 and 03/27/2012.  Findings: Broad-based right-sided subdural hematoma greatest laterally and anteriorly with maximal thickness of 4.3 mm without significant change.  Local mass effect upon the right lateral ventricle with 2.3 mm midline shift to the left stable.  Small amount of subarachnoid blood without significant change.  Anterior right frontal parenchymal hematoma has increased in size as has the amount of surrounding vasogenic edema.  Maximal transverse dimension 2.4 x 2.3 cm versus prior 2.2 x 2.1 cm.  No CT evidence of large acute thrombotic infarct.  No intracranial mass lesions seen separate from the above described findings.  No skull fracture detected.  IMPRESSION:  Anterior right frontal parenchymal hematoma has increased in size as has the amount of surrounding vasogenic edema.  Maximal transverse dimension 2.4 x 2.3 cm versus prior 2.2 x 2.1 cm.  Broad-based right-sided subdural hematoma greatest laterally and anteriorly with maximal thickness of 4.3 mm without significant change.  Local mass effect upon the right lateral ventricle with 2.3 mm midline shift to the left stable.  Small amount of subarachnoid blood without significant change.  This has been made a PRA call report utilizing dashboard call feature.   Original Report Authenticated By: Fuller Canada, M.D.    Dg Cerv Spine Flex&ext Only  03/28/2012  *RADIOLOGY REPORT*  Clinical Data: 56 year old female status post fall with neck pain.  CERVICAL SPINE - FLEXION AND EXTENSION VIEWS ONLY  Comparison: [facility] 03/27/2012 head and cervical spine CT.  Findings: Lateral views in flexion and extension.  In extension prevertebral soft tissue contours are stable within normal limits.  Straightening of cervical lordosis is stable. Cervicothoracic junction alignment is within normal limits.  Little if any range of motion from the presumably neutral position seen on the comparison.  In flexion there is mild reversal of cervical lordosis. Prevertebral soft tissue contours remain normal.  No abnormal motion is identified. Cervicothoracic junction alignment is within normal limits.  IMPRESSION: Decreased range of motion and persistent straightening of cervical lordosis, but no abnormal motion to suggest instability.   Original Report Authenticated By: Harley Hallmark, M.D.     Assessment/Plan:  Subdural hematoma: We'll plan to repeat her CAT scan tomorrow. She may go home tomorrow if she's feeling better.  LOS: 3 days     Gabriele Zwilling D 03/30/2012, 8:49 AM

## 2012-03-31 ENCOUNTER — Inpatient Hospital Stay (HOSPITAL_COMMUNITY): Payer: 59

## 2012-03-31 MED ORDER — INFLUENZA VIRUS VACC SPLIT PF IM SUSP: 0.5000 mL | INTRAMUSCULAR | Status: DC | PRN

## 2012-03-31 MED ORDER — DEXAMETHASONE SODIUM PHOSPHATE 4 MG/ML IJ SOLN
4.0000 mg | Freq: Four times a day (QID) | INTRAMUSCULAR | Status: AC
  Administered 2012-03-31 (×4): 4 mg via INTRAVENOUS
  Filled 2012-03-31 (×4): qty 1

## 2012-03-31 NOTE — Progress Notes (Signed)
  Subjective: Ongoing Migraine, 10/10, worse than usual Migraine.  She normally takes maxalt for this and they get better in about 20 min.  Objective: Vital signs in last 24 hours: Temp:  [97.9 F (36.6 C)-98.3 F (36.8 C)] 98.3 F (36.8 C) (09/15 0600) Pulse Rate:  [63-66] 66  (09/15 0600) Resp:  [16-18] 16  (09/15 0600) BP: (117-132)/(61-64) 132/64 mmHg (09/15 0600) SpO2:  [92 %-97 %] 93 % (09/15 0600)   Afebrile, BSS, No labs, CT head pending Intake/Output from previous day:   Intake/Output this shift:    Neurologic: Alert and oriented X 3, normal strength and tone. Normal symmetric reflexes. Normal coordination and gait Mental status: Alert, oriented, thought content appropriate  Lab Results:  No results found for this basename: WBC:2,HGB:2,HCT:2,PLT:2 in the last 72 hours  BMET  [facility] 03/29/12 0501  NA 139  K 4.1  CL 104  CO2 27  GLUCOSE 107*  BUN 15  CREATININE 0.71  CALCIUM 9.5   PT/INR No results found for this basename: LABPROT:2,INR:2 in the last 72 hours  No results found for this basename: AST:5,ALT:5,ALKPHOS:5,BILITOT:5,PROT:5,ALBUMIN:5 in the last 168 hours   Lipase  No results found for this basename: lipase     Studies/Results: No results found.  Medications:    . citalopram  40 mg Oral Daily  . dexamethasone  4 mg Intravenous Q6H  . nicotine  21 mg Transdermal Daily  . traMADol  100 mg Oral Q6H  . white petrolatum        Assessment/Plan Fall  TBI/SDH/ R frontal ICC -Con't with Imitrex for h/a; repeat CT-H on Sunday AM  Cervical strain  FEN - No issues  VTE - PAS    Plan:  Neurologic exam normal.  Main issue is nausea with migraine.  CT pending.  Control of migraine is primary issue now.     LOS: 4 days    Tuere Nwosu 03/31/2012

## 2012-03-31 NOTE — Progress Notes (Signed)
Agree with above.   CT stable per Rad read.  Will f/u on NSR plans, but likely no intervention. Migraine control.

## 2012-03-31 NOTE — Progress Notes (Signed)
Patient ID: Jenna Stafford, female   DOB: Dec 15, 1955, 56 y.o.   MRN: [id number] Subjective: Patient reports continued 10 out of 10 headache. There is some nausea. Denies visual changes or numbness tingling or weakness.  Objective: Vital signs in last 24 hours: Temp:  [97.9 F (36.6 C)-98.3 F (36.8 C)] 98.3 F (36.8 C) (09/15 0600) Pulse Rate:  [63-66] 66  (09/15 0600) Resp:  [16-18] 16  (09/15 0600) BP: (117-132)/(61-64) 132/64 mmHg (09/15 0600) SpO2:  [92 %-97 %] 93 % (09/15 0600)  Intake/Output from previous day:   Intake/Output this shift:    Neurologic: Grossly normal, Jenna Stafford is awake and conversant. Does appear to be in discomfort. Jenna Stafford moves all extremities. Jenna Stafford follows commands.  Lab Results: Lab Results  Component Value Date   WBC 9.2 03/28/2012   HGB 10.7* 03/28/2012   HCT 31.7* 03/28/2012   MCV 87.1 03/28/2012   PLT 159 03/28/2012   No results found for this basename: INR, PROTIME   BMET Lab Results  Component Value Date   NA 139 03/29/2012   K 4.1 03/29/2012   CL 104 03/29/2012   CO2 27 03/29/2012   GLUCOSE 107* 03/29/2012   BUN 15 03/29/2012   CREATININE 0.71 03/29/2012   CALCIUM 9.5 03/29/2012    Studies/Results: Ct Head Wo Contrast  03/29/2012  *RADIOLOGY REPORT*  Clinical Data: Severe headaches post fall.  Closed head injury.  CT HEAD WITHOUT CONTRAST  Technique:  Contiguous axial images were obtained from the base of the skull through the vertex without contrast.  Comparison: 03/28/2012 and 03/27/2012.  Findings: Broad-based right-sided subdural hematoma greatest laterally and anteriorly with maximal thickness of 4.3 mm without significant change.  Local mass effect upon the right lateral ventricle with 2.3 mm midline shift to the left stable.  Small amount of subarachnoid blood without significant change.  Anterior right frontal parenchymal hematoma has increased in size as has the amount of surrounding vasogenic edema.  Maximal transverse dimension 2.4 x 2.3 cm versus  prior 2.2 x 2.1 cm.  No CT evidence of large acute thrombotic infarct.  No intracranial mass lesions seen separate from the above described findings.  No skull fracture detected.  IMPRESSION: Anterior right frontal parenchymal hematoma has increased in size as has the amount of surrounding vasogenic edema.  Maximal transverse dimension 2.4 x 2.3 cm versus prior 2.2 x 2.1 cm.  Broad-based right-sided subdural hematoma greatest laterally and anteriorly with maximal thickness of 4.3 mm without significant change.  Local mass effect upon the right lateral ventricle with 2.3 mm midline shift to the left stable.  Small amount of subarachnoid blood without significant change.  This has been made a PRA call report utilizing dashboard call feature.   Original Report Authenticated By: Fuller Canada, M.D.     Assessment/Plan: Head CT today. I reviewed yesterday scan and while the right frontal hematoma is larger and do not believe that require surgical intervention. We'll continue to try to control her headaches medically. Her neurologic exam at this point is normal.   LOS: 4 days    Rimas Gilham S 03/31/2012, 6:55 AM

## 2012-04-01 MED ORDER — TOPIRAMATE 25 MG PO TABS
25.0000 mg | ORAL_TABLET | Freq: Every day | ORAL | Status: DC
  Administered 2012-04-01 – 2012-04-02 (×2): 25 mg via ORAL
  Filled 2012-04-01 (×3): qty 1

## 2012-04-01 MED ORDER — SODIUM CHLORIDE 0.45 % IV SOLN
INTRAVENOUS | Status: DC
  Administered 2012-04-02: 11:00:00 via INTRAVENOUS
  Administered 2012-04-02: 1000 mL via INTRAVENOUS
  Administered 2012-04-03 – 2012-04-04 (×2): via INTRAVENOUS

## 2012-04-01 MED ORDER — RIZATRIPTAN BENZOATE 5 MG PO TBDP
5.0000 mg | ORAL_TABLET | ORAL | Status: DC | PRN
  Administered 2012-04-01: 10 mg via ORAL
  Filled 2012-04-01 (×3): qty 2

## 2012-04-01 MED ORDER — SUMATRIPTAN SUCCINATE 6 MG/0.5ML ~~LOC~~ SOLN
6.0000 mg | SUBCUTANEOUS | Status: DC | PRN
  Filled 2012-04-01 (×2): qty 0.5

## 2012-04-01 MED ORDER — NON FORMULARY: 5.0000 mg | Status: DC | PRN

## 2012-04-01 NOTE — Progress Notes (Signed)
Patient ID: Jenna Stafford, female   DOB: 03/15/1956, 56 y.o.   MRN: [id number] Neuro intact Still w nausea/vomiting Hydrate tonight

## 2012-04-01 NOTE — Progress Notes (Signed)
Physical Therapy Treatment Patient Details Name: Jenna Stafford MRN: [id number] DOB: [birth year] Today's Date: 04/01/2012 Time: 7829-5621 PT Time Calculation (min): 11 min  PT Assessment / Plan / Recommendation Comments on Treatment Session    Patient agreeable to ambulation despite headache. Patient with decrease balance and increased swaying with need of outside support this session. Patient encouraged to ambulate with staff later today as tolerated    Follow Up Recommendations  No PT follow up;Supervision for mobility/OOB    Barriers to Discharge        Equipment Recommendations  None recommended by PT    Recommendations for Other Services    Frequency Min 4X/week   Plan Discharge plan remains appropriate;Frequency remains appropriate    Precautions / Restrictions Precautions Precautions: Fall   Pertinent Vitals/Pain 10/10 headache    Mobility  Bed Mobility Supine to Sit: 6: Modified independent (Device/Increase time) Sitting - Scoot to Edge of Bed: 6: Modified independent (Device/Increase time) Transfers Sit to Stand: 5: Supervision Stand to Sit: 5: Supervision Ambulation/Gait Ambulation/Gait Assistance: 4: Min assist Ambulation Distance (Feet): 300 Feet Assistive device: None Ambulation/Gait Assistance Details: Patient requiring A today to control swaying as she had some dizziness from her headache Gait Pattern: Step-through pattern Gait velocity: decreased with guarding General Gait Details: Patient guarded with ambulation this session and occasionally reaching out for outside support Stairs Assistance: 4: Min guard Stair Management Technique: One rail Right;Alternating pattern Number of Stairs: 12     Exercises     PT Diagnosis:    PT Problem List:   PT Treatment Interventions:     PT Goals Acute Rehab PT Goals PT Goal: Supine/Side to Sit - Progress: Met PT Goal: Sit to Supine/Side - Progress: Met PT Goal: Sit to Stand - Progress: Progressing toward  goal PT Goal: Stand to Sit - Progress: Progressing toward goal PT Transfer Goal: Bed to Chair/Chair to Bed - Progress: Progressing toward goal PT Goal: Ambulate - Progress: Progressing toward goal PT Goal: Up/Down Stairs - Progress: Progressing toward goal  Visit Information  Last PT Received On: 04/01/12 Assistance Needed: +1    Subjective Data  Subjective: I have the worse headache ive ever had   Cognition  Overall Cognitive Status: Appears within functional limits for tasks assessed/performed Arousal/Alertness: Awake/alert Orientation Level: Appears intact for tasks assessed Behavior During Session: [facility] for tasks performed    Balance     End of Session PT - End of Session Equipment Utilized During Treatment: Gait belt Activity Tolerance: Patient tolerated treatment well Patient left: in chair;with call bell/phone within reach Nurse Communication: Mobility status   GP     Fredrich Birks 04/01/2012, 8:59 AM 04/01/2012 Fredrich Birks PTA [phone] pager [phone] office

## 2012-04-01 NOTE — Progress Notes (Signed)
Patient ID: Jenna Stafford, female   DOB: 22-Mar-1956, 56 y.o.   MRN: [id number]    Subjective: Still having severe migraines, had nausea last night, worst migraines she has ever had.  Maxalt usually works for her but the Immitrex is not helping.  She has also used Topamax for migraines qhs.  Very miserable this am.  Objective: Vital signs in last 24 hours: Temp:  [98.1 F (36.7 C)-98.8 F (37.1 C)] 98.1 F (36.7 C) (09/16 0535) Pulse Rate:  [57-72] 59  (09/16 0535) Resp:  [16-18] 18  (09/16 0535) BP: (121-147)/(59-76) 126/69 mmHg (09/16 0535) SpO2:  [96 %-98 %] 96 % (09/16 0535)   Afebrile, BSS, No labs, CT head pending Intake/Output from previous day: 09/15 0701 - 09/16 0700 In: 60 [P.O.:60] Out: -  Intake/Output this shift:   Physical exam: General appearance: alert and moderate distress Head: Normocephalic, without obvious abnormality Neurologic: Alert and oriented X 3, normal strength and tone. Normal symmetric reflexes. Normal coordination and gait Mental status: Alert, oriented, thought content appropriate  Lab Results:  No results found for this basename: WBC:2,HGB:2,HCT:2,PLT:2 in the last 72 hours  BMET No results found for this basename: NA:2,K:2,CL:2,CO2:2,GLUCOSE:2,BUN:2,CREATININE:2,CALCIUM:2 in the last 72 hours PT/INR No results found for this basename: LABPROT:2,INR:2 in the last 72 hours  No results found for this basename: AST:5,ALT:5,ALKPHOS:5,BILITOT:5,PROT:5,ALBUMIN:5 in the last 168 hours   Lipase  No results found for this basename: lipase     Studies/Results: Ct Head Wo Contrast  03/31/2012  *RADIOLOGY REPORT*  Clinical Data: Head trauma with intracranial hemorrhage.  Follow- up.  CT HEAD WITHOUT CONTRAST  Technique:  Contiguous axial images were obtained from the base of the skull through the vertex without contrast.  Comparison: 03/29/2012  Findings: Hemorrhagic contusion in the right to frontal lobe is unchanged with hematoma diameter of 2.2 cm.   There is slightly more surrounding edema.  Other petechial intraparenchymal hemorrhages throughout the right frontal region, the right temporal tip, and the left lateral temporal lobe are present.  There is more edema in the region of the right temporal contusions.  The left temporal contusions are more apparent on today's study, with more surrounding edema.  Subdural blood along the tentorium and along the convexity on the right has not increased, with maximal thickness towards the vertex of about 4 mm.  Small amount of scattered subarachnoid blood is present.  Due to asymmetric injury of the right hemisphere, there is right to left shift of 2 mm.  IMPRESSION: Right frontal hemorrhagic contusion has not developed any more blood accumulation, with maximal diameter of 2.2 cm.  There is more surrounding vasogenic edema.  Other hemorrhagic contusions in the right frontal lobe and right temporal lobe are present, more evident because of increased surrounding edema.  Hemorrhagic contusion in the left lateral temporal lobe is more evident, possibly due to very slightly more blood accumulation but probably primarily to more notable surrounding edema.  Small amount subarachnoid blood appears the same.  Thin subdural hematomas along the tentorium and along the convexity have not enlarged, maximal thickness 4 mm.  Mass effect with right-to-left shift of 2 mm is the same.   Original Report Authenticated By: Thomasenia Sales, M.D.     Medications:    . citalopram  40 mg Oral Daily  . dexamethasone  4 mg Intravenous Q6H  . nicotine  21 mg Transdermal Daily  . topiramate  25 mg Oral Daily  . traMADol  100 mg Oral Q6H  Assessment/Plan Fall  TBI/SDH/ R frontal ICC - head CT shows no new bleeding, edema is present, patient having post-traumatic migraines, Immitrex not working but Maxalt has worked well for her, this is non formulary but have asked pharmacy to get this for her.  Also will add topamax to help control  symptoms.  Will definitely need to follow up with her neurologist as an outpatient for close medication adjustments as this will likely be a long term issue. Cervical strain  FEN - No issues  VTE - PAS       LOS: 5 days    Brendy Ficek 04/01/2012

## 2012-04-01 NOTE — Progress Notes (Signed)
UR completed 

## 2012-04-01 NOTE — Progress Notes (Signed)
Pt continue to complained of severe headache to the back of head and neck with no relief with pain  medication nor cold compresses. Pt neurologically stable, no nuchal rigidity. Vital signs T 98.83F  HR 57 BP 121/59  R 18  O2 Sat 97 %.   MD Lovell Sheehan on call notified. To continue with current medications and no new orders given.WIll continue to monitor patient   .

## 2012-04-02 MED ORDER — RIZATRIPTAN BENZOATE 10 MG PO TBDP
10.0000 mg | ORAL_TABLET | ORAL | Status: AC | PRN
  Administered 2012-04-03 – 2012-04-04 (×3): 10 mg via ORAL
  Filled 2012-04-02 (×3): qty 1

## 2012-04-02 MED ORDER — LIDOCAINE HCL 2 % IJ SOLN
10.0000 mL | Freq: Once | INTRAMUSCULAR | Status: DC
  Filled 2012-04-02 (×2): qty 10

## 2012-04-02 MED ORDER — PANTOPRAZOLE SODIUM 40 MG PO TBEC
40.0000 mg | DELAYED_RELEASE_TABLET | Freq: Two times a day (BID) | ORAL | Status: DC
  Administered 2012-04-03: 40 mg via ORAL
  Filled 2012-04-02: qty 1

## 2012-04-02 MED ORDER — BUTALBITAL-APAP-CAFFEINE 50-325-40 MG PO TABS
2.0000 | ORAL_TABLET | ORAL | Status: DC | PRN
  Administered 2012-04-03 – 2012-04-05 (×5): 2 via ORAL
  Filled 2012-04-02 (×5): qty 2

## 2012-04-02 MED ORDER — DEXAMETHASONE SODIUM PHOSPHATE 4 MG/ML IJ SOLN
8.0000 mg | Freq: Four times a day (QID) | INTRAMUSCULAR | Status: AC
  Administered 2012-04-02 – 2012-04-03 (×3): 8 mg via INTRAVENOUS
  Filled 2012-04-02 (×3): qty 2

## 2012-04-02 MED ORDER — RIZATRIPTAN BENZOATE 5 MG PO TBDP: 5.0000 mg | ORAL_TABLET | ORAL | Status: DC | PRN

## 2012-04-02 MED ORDER — PROPOFOL 10 MG/ML IV BOLUS
20.0000 mg | INTRAVENOUS | Status: DC | PRN
  Administered 2012-04-02 (×2): 20 mg via INTRAVENOUS
  Filled 2012-04-02: qty 20

## 2012-04-02 NOTE — Progress Notes (Signed)
Physical Therapy Treatment Patient Details Name: Jenna Stafford MRN: [id number] DOB: 1955/11/23 Today's Date: 04/02/2012 Time: 4098-1191 PT Time Calculation (min): 13 min  PT Assessment / Plan / Recommendation Comments on Treatment Session  Patient continues to have headaches and today that limited her session with therapy as she wasn't able to tolerate ambulating in hallways due to the light and had decreased balance with the ambulation she performed. Will continue to follow with current POC    Follow Up Recommendations  No PT follow up;Supervision for mobility/OOB    Barriers to Discharge        Equipment Recommendations  None recommended by PT    Recommendations for Other Services    Frequency Min 4X/week   Plan Discharge plan remains appropriate;Frequency remains appropriate    Precautions / Restrictions Precautions Precautions: Fall   Pertinent Vitals/Pain     Mobility  Bed Mobility Supine to Sit: 6: Modified independent (Device/Increase time) Transfers Sit to Stand: 5: Supervision;With upper extremity assist;From bed;From toilet Stand to Sit: 5: Supervision;With upper extremity assist;To toilet;To bed Details for Transfer Assistance: patient a little shaky and unsteady with both stands today Ambulation/Gait Ambulation/Gait Assistance: 4: Min assist Ambulation Distance (Feet): 110 Feet Assistive device: Other (Comment) (IV pole) Ambulation/Gait Assistance Details: Patient requiring Min A for one LOB when turning out of room. Patient stated that the light was too much and caused her to stumble. Patient requested to return to her room.  Gait Pattern: Step-through pattern    Exercises     PT Diagnosis:    PT Problem List:   PT Treatment Interventions:     PT Goals Acute Rehab PT Goals PT Goal: Supine/Side to Sit - Progress: Met PT Goal: Sit to Supine/Side - Progress: Met PT Goal: Sit to Stand - Progress: Progressing toward goal PT Goal: Stand to Sit - Progress:  Progressing toward goal PT Goal: Ambulate - Progress: Progressing toward goal  Visit Information  Last PT Received On: 04/02/12 Assistance Needed: +1    Subjective Data      Cognition  Overall Cognitive Status: Appears within functional limits for tasks assessed/performed Arousal/Alertness: Awake/alert Orientation Level: Appears intact for tasks assessed Behavior During Session: [facility] for tasks performed    Balance     End of Session PT - End of Session Equipment Utilized During Treatment: Gait belt Activity Tolerance: Patient limited by pain;Other (comment) (headache) Patient left: in bed;with call bell/phone within reach Nurse Communication: Mobility status   GP     Fredrich Birks 04/02/2012, 9:03 AM 04/02/2012 Fredrich Birks PTA [phone] pager [phone] office

## 2012-04-02 NOTE — Progress Notes (Signed)
Conscious sedation monitoring during administration of Propofol (10ml) and 2%Lidocane (1ml) mixture for HA [place] PA and MD at bedside. 2 does of 2.43ml given IV Patient stated HA 8/10 post 1st dose Post 2nd dose patient, difficult to arouse initially then stated HA 8/10 When asked to rate her pain again about post 2nd dose, pt stated that it is 6/10. See Doc Flowsheets for VS

## 2012-04-02 NOTE — Progress Notes (Signed)
Subjective: Patient reports Worsening a worse nausea that otherwise nonfocal  Objective: Vital signs in last 24 hours: Temp:  [98.1 F (36.7 C)-98.5 F (36.9 C)] 98.5 F (36.9 C) (09/17 1400) Pulse Rate:  [62-70] 70  (09/17 1400) Resp:  [16-18] 18  (09/17 1400) BP: (117-138)/(63-76) 117/76 mmHg (09/17 1400) SpO2:  [97 %-100 %] 100 % (09/17 1400)  Intake/Output from previous day:   Intake/Output this shift:    Awake alert oriented strength out of 5  Lab Results: No results found for this basename: WBC:2,HGB:2,HCT:2,PLT:2 in the last 72 hours BMET No results found for this basename: NA:2,K:2,CL:2,CO2:2,GLUCOSE:2,BUN:2,CREATININE:2,CALCIUM:2 in the last 72 hours  Studies/Results: No results found.  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

## 2012-04-02 NOTE — Progress Notes (Signed)
Pt discussed at hospital LOS meeting this am.

## 2012-04-02 NOTE — Progress Notes (Signed)
Patient ID: Jenna Stafford, female   DOB: 06/16/1956, 56 y.o.   MRN: [id number]    Subjective: Pt reports severe headache again today, less talkative today and very sensitive to sound and light, unable to tolerate PT due to balance and headache,   Objective: Vital signs in last 24 hours: Temp:  [98.1 F (36.7 C)] 98.1 F (36.7 C) (09/17 0600) Pulse Rate:  [57-64] 64  (09/17 0600) Resp:  [16-18] 16  (09/17 0600) BP: (136-166)/(63-67) 136/63 mmHg (09/17 0600) SpO2:  [96 %-98 %] 98 % (09/17 0600)    Intake/Output from previous day:   Intake/Output this shift:   Physical exam: General appearance: alert and moderate distress Head: tender to palpatation Neurologic: Alert and oriented X 3, normal strength and tone. Normal symmetric reflexes. Normal coordination and gait Mental status: Alert, oriented, thought content appropriate More sensitive today to sound and light, seems in more pain today then yesterday  Lab Results:  No results found for this basename: WBC:2,HGB:2,HCT:2,PLT:2 in the last 72 hours  BMET No results found for this basename: NA:2,K:2,CL:2,CO2:2,GLUCOSE:2,BUN:2,CREATININE:2,CALCIUM:2 in the last 72 hours PT/INR No results found for this basename: LABPROT:2,INR:2 in the last 72 hours  No results found for this basename: AST:5,ALT:5,ALKPHOS:5,BILITOT:5,PROT:5,ALBUMIN:5 in the last 168 hours   Lipase  No results found for this basename: lipase     Studies/Results: No results found.  Medications:    . citalopram  40 mg Oral Daily  . nicotine  21 mg Transdermal Daily  . topiramate  25 mg Oral Daily  . traMADol  100 mg Oral Q6H    Assessment/Plan Fall  TBI/SDH/ R frontal ICC - head CT shows no new bleeding, edema is present- will decrease IVFs some, patient having post-traumatic migraines, Immitrex not working but Maxalt has worked well for her, this is non formulary but have asked pharmacy to get this for her.  Order Imitrex Maybeury to see if this can help at all  while waiting for Maxalt.  Topamax started last night. Will definitely need to follow up with her neurologist as an outpatient for close medication adjustments as this will likely be a long term issue. Cervical strain  FEN - No issues  VTE - PAS        LOS: 6 days    WHITE, ELIZABETH 04/02/2012

## 2012-04-03 MED ORDER — OXYCODONE HCL 10 MG PO TB12
20.0000 mg | ORAL_TABLET | Freq: Two times a day (BID) | ORAL | Status: DC
  Administered 2012-04-03 (×2): 20 mg via ORAL
  Filled 2012-04-03 (×3): qty 2

## 2012-04-03 MED ORDER — DEXLANSOPRAZOLE 30 MG PO CPDR
30.0000 mg | DELAYED_RELEASE_CAPSULE | Freq: Every day | ORAL | Status: DC
  Administered 2012-04-04 – 2012-04-05 (×2): 30 mg via ORAL
  Filled 2012-04-03 (×4): qty 1

## 2012-04-03 MED ORDER — SUCRALFATE 1 G PO TABS
1.0000 g | ORAL_TABLET | Freq: Three times a day (TID) | ORAL | Status: DC
  Administered 2012-04-03 – 2012-04-05 (×10): 1 g via ORAL
  Filled 2012-04-03 (×12): qty 1

## 2012-04-03 MED ORDER — METOCLOPRAMIDE HCL 10 MG PO TABS
10.0000 mg | ORAL_TABLET | Freq: Four times a day (QID) | ORAL | Status: DC | PRN
  Administered 2012-04-03 – 2012-04-05 (×3): 10 mg via ORAL
  Filled 2012-04-03 (×6): qty 1

## 2012-04-03 MED ORDER — NON FORMULARY: 30.0000 mg | Freq: Every day | Status: DC

## 2012-04-03 MED ORDER — LORATADINE 10 MG PO TABS
10.0000 mg | ORAL_TABLET | Freq: Every day | ORAL | Status: DC
  Administered 2012-04-03 – 2012-04-05 (×3): 10 mg via ORAL
  Filled 2012-04-03 (×3): qty 1

## 2012-04-03 MED ORDER — TOPIRAMATE 25 MG PO TABS
50.0000 mg | ORAL_TABLET | Freq: Every day | ORAL | Status: DC
  Administered 2012-04-03 – 2012-04-04 (×2): 50 mg via ORAL
  Filled 2012-04-03 (×3): qty 2

## 2012-04-03 NOTE — Progress Notes (Signed)
Clinical Social Work Department BRIEF PSYCHOSOCIAL ASSESSMENT 04/03/2012  Patient:  Jenna Stafford,Jenna Stafford     Account Number:  [id number]     Admit date:  03/27/2012  Clinical Social Worker:  Dennison Bulla  Date/Time:  04/03/2012 03:45 PM  Referred by:  Physician  Date Referred:  04/03/2012 Referred for  Psychosocial assessment   Other Referral:   Interview type:  Patient Other interview type:    PSYCHOSOCIAL DATA Living Status:  FAMILY Admitted from facility:   Level of care:   Primary support name:  Casimiro Needle Primary support relationship to patient:  SPOUSE Degree of support available:   Strong    CURRENT CONCERNS Current Concerns  Other - See comment   Other Concerns:   Psychosocial assessment    SOCIAL WORK ASSESSMENT / PLAN CSW reviewed chart before meeting with patient. CSW met with patient at bedside. No visitors were present.    CSW introduced myself and explained role. Patient was laying in bed but agreeable to assessment. Patient lives with husband but was brought to the hospital after falling at home. Patient feels she was dehydrated and fell in the bathroom and hit her head. Patient reports that she is hopeful to return home in the next day or so. Patient is aware she cannot drive and reports she will take time off of work. 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 Patient reports that when she is dc from the hospital that her husband is taking time off work and sister-in-law will assist her. Patient reports no needs at this time.    CSW and patient discussed substance use. 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 Patient reports she attends AA regularly and has a good support system through AA. SBIRT is completed and CSW encouraged patient to continue to attend meetings. CSW is signing off but available if needed.   Assessment/plan status:  No Further Intervention Required Other assessment/ plan:   SBIRT   Information/referral to  community resources:   Patient feels comfortable with friends and family support.  Patient agreeable to continue with AA meetings.    PATIENT'S/FAMILY'S RESPONSE TO PLAN OF CARE: Patient was alert and oriented. Patient was pleasant and engaged throughout the entire assessment. Patient receptive to CSW consult but reports no needs at this time.        Coverage for [employer]

## 2012-04-03 NOTE — Progress Notes (Signed)
Subjective: Patient reports that she feels better this morning I think the steroids have helped her she still has headache and nausea is better  Objective: Vital signs in last 24 hours: Temp:  [98.1 F (36.7 C)-99.1 F (37.3 C)] 98.2 F (36.8 C) (09/18 1320) Pulse Rate:  [65-73] 69  (09/18 1320) Resp:  [12-18] 18  (09/18 1320) BP: (118-159)/(65-79) 119/67 mmHg (09/18 1320) SpO2:  [94 %-100 %] 100 % (09/18 1320)  Intake/Output from previous day: 09/17 0701 - 09/18 0700 In: 1000 [I.V.:1000] Out: -  Intake/Output this shift:    Alert oriented neurologically intact  Lab Results: No results found for this basename: WBC:2,HGB:2,HCT:2,PLT:2 in the last 72 hours BMET No results found for this basename: NA:2,K:2,CL:2,CO2:2,GLUCOSE:2,BUN:2,CREATININE:2,CALCIUM:2 in the last 72 hours  Studies/Results: No results found.  Assessment/Plan: Him continue progressive mobilization discharged when tolerating by mouth and headaches controlled  LOS: 7 days     Marypat Kimmet P 04/03/2012, 2:12 PM

## 2012-04-03 NOTE — Progress Notes (Signed)
Patient ID: Jenna Stafford, female   DOB: Nov 19, 1955, 56 y.o.   MRN: [id number]    Subjective: Seems in better spirits and less sensitive to light and sound but reports her headache is no better then yesterday, the propofol injection did help a little.  Also has h/o gastric bypass and is usually on Carafate and PPI for this.  Also has severe seasonal allergies that trigger her migraines and is usually on zyrtec for that.  Objective: Vital signs in last 24 hours: Temp:  [98.1 F (36.7 C)-99.1 F (37.3 C)] 98.8 F (37.1 C) (09/18 0600) Pulse Rate:  [65-73] 70  (09/18 0600) Resp:  [12-18] 16  (09/18 0600) BP: (117-159)/(65-79) 118/65 mmHg (09/18 0600) SpO2:  [94 %-100 %] 98 % (09/18 0600)    Intake/Output from previous day: 09/17 0701 - 09/18 0700 In: 1000 [I.V.:1000] Out: -  Intake/Output this shift:   Physical exam: General appearance: alert and no distress Neurologic: Alert and oriented X 3, normal strength and tone. Normal symmetric reflexes. Normal coordination and gait Mental status: Alert, oriented, thought content appropriate seems in less pain today and lights in room are on  Lab Results:  No results found for this basename: WBC:2,HGB:2,HCT:2,PLT:2 in the last 72 hours  BMET No results found for this basename: NA:2,K:2,CL:2,CO2:2,GLUCOSE:2,BUN:2,CREATININE:2,CALCIUM:2 in the last 72 hours PT/INR No results found for this basename: LABPROT:2,INR:2 in the last 72 hours  No results found for this basename: AST:5,ALT:5,ALKPHOS:5,BILITOT:5,PROT:5,ALBUMIN:5 in the last 168 hours   Lipase  No results found for this basename: lipase     Studies/Results: No results found.  Medications:    . citalopram  40 mg Oral Daily  . dexamethasone  8 mg Intravenous Q6H  . lidocaine  10 mL Other Once  . loratadine  10 mg Oral Daily  . nicotine  21 mg Transdermal Daily  . NON FORMULARY 30 mg  30 mg Oral Q0600  . sucralfate  1 g Oral TID WC & HS  . topiramate  50 mg Oral QHS  .  traMADol  100 mg Oral Q6H  . DISCONTD: pantoprazole  40 mg Oral BID AC  . DISCONTD: topiramate  25 mg Oral Daily    Assessment/Plan Fall  TBI/SDH/ R frontal ICC - Still having severe headaches with nausea, will add reglan to help with both the nausea and the headache.  Will increase topamax to 50mg  tonight. Will definitely need to follow up with her neurologist as an outpatient for close medication adjustments as this will likely be a long term issue. H/o Gastric bypass and gastric ulcer - will add dexilant and Carafate, dexilant due to its lower profile side effect for headaches.   Cervical strain  FEN - No issues  VTE - PAS        LOS: 7 days    WHITE, ELIZABETH 04/03/2012   Pt seen and examined.  She is slowly improving but still with headaches and frequent nausea.  Continue PT and will need neurology follow up as an outpatient

## 2012-04-03 NOTE — Progress Notes (Signed)
PT Cancellation Note  Treatment cancelled today due to patient requesting to hold ambulation today due to headache. Patient not discharging today. Will see tomorrow to test mobility and stairs again.  Thanks 04/03/2012 Fredrich Birks PTA [phone] pager [phone] office     Fredrich Birks 04/03/2012, 11:01 AM

## 2012-04-04 MED ORDER — OXYCODONE HCL 10 MG PO TB12
40.0000 mg | ORAL_TABLET | Freq: Two times a day (BID) | ORAL | Status: DC
  Administered 2012-04-04 – 2012-04-05 (×2): 40 mg via ORAL
  Filled 2012-04-04 (×2): qty 4

## 2012-04-04 MED ORDER — OXYCODONE HCL 10 MG PO TB12
20.0000 mg | ORAL_TABLET | Freq: Once | ORAL | Status: AC
  Administered 2012-04-04: 20 mg via ORAL

## 2012-04-04 NOTE — Progress Notes (Signed)
Agree with signing off, goals met 04/04/2012 Milana Kidney DPT PAGER: [phone] OFFICE: [phone]

## 2012-04-04 NOTE — Progress Notes (Signed)
Patient ID: Jenna Stafford, female   DOB: 20-Dec-1955, 56 y.o.   MRN: [id number]   LOS: 8 days   Subjective: Pain continues to be a limiting factor, still 10/10.  Objective: Vital signs in last 24 hours: Temp:  [98 F (36.7 C)-98.4 F (36.9 C)] 98.4 F (36.9 C) (09/19 0600) Pulse Rate:  [65-69] 65  (09/19 0600) Resp:  [18] 18  (09/19 0600) BP: (111-119)/(50-67) 111/50 mmHg (09/19 0600) SpO2:  [97 %-100 %] 98 % (09/19 0600) Last BM Date: 03/28/12   General appearance: alert and mild distress Resp: clear to auscultation bilaterally Cardio: regular rate and rhythm   Assessment/Plan: Fall  TBI/SDH/ R frontal ICC - Increase oxycontin  Cervical strain  FEN - No issues. Will d/c tramadol to reduce pill burden VTE - PAS  Dispo -- Pain control    Freeman Caldron, PA-C Pager: [phone] General Trauma PA Pager: [phone]   04/04/2012

## 2012-04-04 NOTE — Progress Notes (Signed)
Physical Therapy Treatment Patient Details Name: Jenna Stafford MRN: [id number] DOB: 11/07/1955 Today's Date: 04/04/2012 Time: 4098-1191 PT Time Calculation (min): 13 min  PT Assessment / Plan / Recommendation Comments on Treatment Session  Patient progressing much better today. Is walking in hallways without assistance. Will encourage daily ambulation with nursing staff and will sign off of acute PT at this time    Follow Up Recommendations  No PT follow up;Supervision for mobility/OOB    Barriers to Discharge        Equipment Recommendations  None recommended by PT    Recommendations for Other Services    Frequency     Plan All goals met and education completed, patient dischaged from PT services    Precautions / Restrictions Precautions Precautions: Fall   Pertinent Vitals/Pain     Mobility  Bed Mobility Supine to Sit: 7: Independent Sitting - Scoot to Edge of Bed: 7: Independent Transfers Sit to Stand: 6: Modified independent (Device/Increase time) Stand to Sit: 6: Modified independent (Device/Increase time) Ambulation/Gait Ambulation/Gait Assistance: 6: Modified independent (Device/Increase time) Ambulation Distance (Feet): 600 Feet Assistive device: None Ambulation/Gait Assistance Details: Patient ambulating without LOB this session and no stagering.  Gait Pattern: Within Functional Limits Gait velocity: decreased slightly    Exercises     PT Diagnosis:    PT Problem List:   PT Treatment Interventions:     PT Goals Acute Rehab PT Goals PT Goal: Supine/Side to Sit - Progress: Met PT Goal: Sit to Supine/Side - Progress: Met PT Goal: Sit to Stand - Progress: Met PT Goal: Stand to Sit - Progress: Met PT Transfer Goal: Bed to Chair/Chair to Bed - Progress: Met PT Goal: Ambulate - Progress: Met PT Goal: Up/Down Stairs - Progress: Other (comment) (not tested this session. Has met goal previously)  Visit Information  Last PT Received On: 04/04/12 Assistance  Needed: +1    Subjective Data      Cognition  Overall Cognitive Status: Appears within functional limits for tasks assessed/performed Arousal/Alertness: Awake/alert Orientation Level: Appears intact for tasks assessed Behavior During Session: [facility] for tasks performed    Balance     End of Session PT - End of Session Equipment Utilized During Treatment: Gait belt Activity Tolerance: Patient tolerated treatment well Patient left: in chair Nurse Communication: Mobility status   GP     Fredrich Birks 04/04/2012, 9:55 AM 04/04/2012 Fredrich Birks PTA [phone] pager [phone] office

## 2012-04-04 NOTE — Progress Notes (Signed)
Seen and agreed.

## 2012-04-04 NOTE — Progress Notes (Addendum)
UR completed.   Pt discussed in hospital LOS meeting today.  

## 2012-04-05 MED ORDER — ONDANSETRON HCL 4 MG PO TABS: 4.0000 mg | ORAL_TABLET | ORAL | Status: AC | PRN

## 2012-04-05 MED ORDER — BUTALBITAL-APAP-CAFFEINE 50-325-40 MG PO TABS: 2.0000 | ORAL_TABLET | ORAL | Status: DC | PRN

## 2012-04-05 MED ORDER — RIZATRIPTAN BENZOATE 10 MG PO TBDP: 10.0000 mg | ORAL_TABLET | ORAL | Status: DC | PRN

## 2012-04-05 MED ORDER — OXYCODONE HCL 40 MG PO TB12: 40.0000 mg | ORAL_TABLET | Freq: Two times a day (BID) | ORAL | Status: DC

## 2012-04-05 MED ORDER — OXYCODONE HCL 10 MG PO TABS: 10.0000 mg | ORAL_TABLET | ORAL | Status: DC | PRN

## 2012-04-05 MED ORDER — TOPIRAMATE 50 MG PO TABS: 50.0000 mg | ORAL_TABLET | Freq: Every day | ORAL | Status: DC

## 2012-04-05 NOTE — Progress Notes (Signed)
Rolling walker arranged for pt's d/c.

## 2012-04-05 NOTE — Discharge Summary (Signed)
Physician Discharge Summary  Patient ID: Jenna Stafford MRN: [id number] DOB/AGE: 03/20/1956 56 y.o.  Admit date: 03/27/2012 Discharge date: 04/05/2012  Discharge Diagnoses Patient Active Problem List   Diagnosis Date Noted  . Fall 03/29/2012  . Traumatic subdural hematoma 03/29/2012  . 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 Acute blood loss anemia 03/29/2012  . Migraine 03/29/2012  . Depression 03/29/2012  . Tobacco use 03/29/2012    Consultants Dr. Donalee Citrin for neurosurgery  Procedures None  HPI: Jenna Stafford likely fell at home. Her husband came home and found her in bed confused with a bump on her posterior head. He took her to [facility] where she underwent a head CT that showed a brain injury. At the time, she was alert, but slow to respond to commands and answer questions. She was transferred to [facility] for further management. She remained neurologically intact and was admitted to the trauma service with neurosurgery consulting.   Hospital Course: Repeat head CT's showed initial worsening of the patient's brain injury though she did not change neurologically. Throughout her hospital stay she suffered debilitating headaches with nausea and photophobia that we were unable to treat well despite multiple modalities of medication. She did make some slight improvement toward the end of her hospital stay and felt she would be able to manage at home. She was mobilized with physical and occupational therapies and was at the supervision level of care. Cognitive therapy recommended continued treatment as an outpatient. She was discharged home in stable condition in the care of her husband.      Medication List     As of 04/05/2012  9:40 AM    TAKE these medications         butalbital-acetaminophen-caffeine 50-325-40 MG per tablet   Commonly known as: FIORICET, ESGIC   Take 2 tablets by mouth every 4 (four) hours as needed for headache.      citalopram 40 MG tablet   Commonly known as: CELEXA   Take 40 mg by mouth daily.      ondansetron 4 MG tablet   Commonly known as: ZOFRAN   Take 1 tablet (4 mg total) by mouth every 4 (four) hours as needed for nausea.      Oxycodone HCl 10 MG Tabs   Take 1-2 tablets (10-20 mg total) by mouth every 4 (four) hours as needed (10mg  for mild pain, 15mg  for moderate pain, 20mg  for severe pain).      oxyCODONE 40 MG 12 hr tablet   Commonly known as: OXYCONTIN   Take 1 tablet (40 mg total) by mouth every 12 (twelve) hours.      rizatriptan 10 MG disintegrating tablet   Commonly known as: MAXALT-MLT   Take 1 tablet (10 mg total) by mouth as needed for migraine. May repeat in 2 hours if needed      topiramate 50 MG tablet   Commonly known as: TOPAMAX   Take 1 tablet (50 mg total) by mouth at bedtime.      VITAMIN D PO   Take 1 tablet by mouth daily.      VITAMIN E PO   Take 1 tablet by mouth daily.         Follow-up Information    Follow up with CRAM,GARY P, MD. Schedule an appointment as soon as possible for a visit in 2 weeks. (head CT)    Contact information:   [address] [place] [address] [phone]  Call [facility] [place]. (As needed)    Contact information:   [address] [place] [address] [phone]      Schedule an appointment as soon as possible for a visit with Speech therapy for cognition.         Signed: Freeman Caldron, PA-C Pager: [phone] General Trauma [facility] Pager: [phone]  04/05/2012, 9:40 AM

## 2012-04-05 NOTE — Discharge Summary (Signed)
Jenna Kreager M. Demeco Ducksworth, MD, FACS General, Bariatric, & Minimally Invasive Surgery [facility]  

## 2012-04-05 NOTE — Progress Notes (Signed)
Patient ID: Jenna Stafford, female   DOB: 1956-01-24, 56 y.o.   MRN: [id number]   LOS: 9 days   Subjective: Pt ready to go home. Even though she took two doses of morphine this morning she thinks she'll be ok.  Objective: Vital signs in last 24 hours: Temp:  [98.2 F (36.8 C)-99 F (37.2 C)] 98.7 F (37.1 C) (09/20 0620) Pulse Rate:  [62-67] 67  (09/20 0620) Resp:  [16-18] 16  (09/20 0620) BP: (102-115)/(52-61) 102/61 mmHg (09/20 0620) SpO2:  [96 %-99 %] 96 % (09/20 0620) Last BM Date: 03/28/12   General appearance: alert and mild distress Resp: clear to auscultation bilaterally Cardio: regular rate and rhythm GI: normal findings: bowel sounds normal and soft, non-tender   Assessment/Plan: Fall  TBI/SDH/ R frontal ICC Cervical strain  Dispo -- Home    Freeman Caldron, PA-C Pager: [phone] General Trauma [facility] Pager: [phone]   04/05/2012

## 2012-04-05 NOTE — Progress Notes (Signed)
Ethelda Deangelo M. Jaykob Minichiello, MD, FACS General, Bariatric, & Minimally Invasive [facility]  

## 2012-04-05 NOTE — Progress Notes (Signed)
Subjective: Patient reports Patient feels better this morning improve nausea still significant headache she feels her she wants to go home.  Objective: Vital signs in last 24 hours: Temp:  [98.2 F (36.8 C)-99 F (37.2 C)] 98.7 F (37.1 C) (09/20 0620) Pulse Rate:  [62-67] 67  (09/20 0620) Resp:  [16-18] 16  (09/20 0620) BP: (102-115)/(52-61) 102/61 mmHg (09/20 0620) SpO2:  [96 %-99 %] 96 % (09/20 0620)  Intake/Output from previous day: 09/19 0701 - 09/20 0700 In: 1000 [I.V.:1000] Out: -  Intake/Output this shift:    Awake alert or his strength out of 50 no pronator drift  Lab Results: No results found for this basename: WBC:2,HGB:2,HCT:2,PLT:2 in the last 72 hours BMET No results found for this basename: NA:2,K:2,CL:2,CO2:2,GLUCOSE:2,BUN:2,CREATININE:2,CALCIUM:2 in the last 72 hours  Studies/Results: No results found.  Assessment/Plan: Stable to be discharged home per trauma the patient follow up in 1-2 weeks.  LOS: 9 days     Sayge Brienza P 04/05/2012, 7:35 AM

## 2012-04-10 ENCOUNTER — Other Ambulatory Visit: Payer: Self-pay | Admitting: Neurosurgery

## 2012-04-10 DIAGNOSIS — S065XAA Traumatic subdural hemorrhage with loss of consciousness status unknown, initial encounter: Secondary | ICD-10-CM

## 2012-04-10 DIAGNOSIS — S065X9A Traumatic subdural hemorrhage with loss of consciousness of unspecified duration, initial encounter: Secondary | ICD-10-CM

## 2012-04-11 ENCOUNTER — Ambulatory Visit
Admission: RE | Admit: 2012-04-11 | Discharge: 2012-04-11 | Disposition: A | Discharge status: Home / Self Care | Payer: 59 | Source: Ambulatory Visit | Attending: Neurosurgery | Admitting: Neurosurgery

## 2012-04-11 DIAGNOSIS — S065X9A Traumatic subdural hemorrhage with loss of consciousness of unspecified duration, initial encounter: Secondary | ICD-10-CM

## 2012-04-15 ENCOUNTER — Telehealth (HOSPITAL_COMMUNITY): Payer: Self-pay | Admitting: Orthopedic Surgery

## 2012-04-15 MED ORDER — HYDROCODONE-ACETAMINOPHEN 10-325 MG PO TABS: 1.0000 | ORAL_TABLET | ORAL | Status: DC | PRN

## 2012-04-15 MED ORDER — TRAMADOL HCL 50 MG PO TABS: 100.0000 mg | ORAL_TABLET | Freq: Four times a day (QID) | ORAL | Status: DC

## 2012-04-15 NOTE — Telephone Encounter (Signed)
Husband called to get refill and wanted to see if there was anything she could take that was non-narcotic as they are both concerned about that. I mentioned that we had had Becky on tramadol in the hospital and it didn't seem to help a lot but that we could try it again if they wanted. I also recommended trying Norco 10mg  instead of the oxy IR 10mg  as it would be easier for them since I could call it in. They are going to try that combination and call me tomorrow to let me know how it worked.

## 2012-04-16 ENCOUNTER — Telehealth (HOSPITAL_COMMUNITY): Payer: Self-pay | Admitting: Orthopedic Surgery

## 2012-04-16 NOTE — Telephone Encounter (Signed)
Given Becky's continued problems I spoke with Dr. Riley Kill in about seeing the patient vs referral to neurology. He thought he could probably see her quicker and so I made an appointment for her with him for tomorrow morning at 8:45. It sounds as though Dr. Wynetta Emery may be referring her to a neurologist as well.

## 2012-04-17 ENCOUNTER — Encounter: Payer: 59 | Attending: Physical Medicine & Rehabilitation | Admitting: Physical Medicine & Rehabilitation

## 2012-04-17 ENCOUNTER — Encounter: Payer: Self-pay | Admitting: Physical Medicine & Rehabilitation

## 2012-04-17 VITALS — BP 125/72 | HR 69 | Resp 14 | Ht 65.0 in | Wt 143.0 lb

## 2012-04-17 DIAGNOSIS — G43909 Migraine, unspecified, not intractable, without status migrainosus: Secondary | ICD-10-CM | POA: Insufficient documentation

## 2012-04-17 DIAGNOSIS — S06369A Traumatic hemorrhage of cerebrum, unspecified, with loss of consciousness of unspecified duration, initial encounter: Secondary | ICD-10-CM

## 2012-04-17 DIAGNOSIS — Z5181 Encounter for therapeutic drug level monitoring: Secondary | ICD-10-CM

## 2012-04-17 DIAGNOSIS — IMO0002 Reserved for concepts with insufficient information to code with codable children: Secondary | ICD-10-CM

## 2012-04-17 DIAGNOSIS — D649 Anemia, unspecified: Secondary | ICD-10-CM | POA: Insufficient documentation

## 2012-04-17 DIAGNOSIS — X58XXXA Exposure to other specified factors, initial encounter: Secondary | ICD-10-CM | POA: Insufficient documentation

## 2012-04-17 DIAGNOSIS — H8110 Benign paroxysmal vertigo, unspecified ear: Secondary | ICD-10-CM

## 2012-04-17 DIAGNOSIS — D62 Acute posthemorrhagic anemia: Secondary | ICD-10-CM

## 2012-04-17 DIAGNOSIS — S065X9A Traumatic subdural hemorrhage with loss of consciousness of unspecified duration, initial encounter: Secondary | ICD-10-CM

## 2012-04-17 DIAGNOSIS — S069X9A Unspecified intracranial injury with loss of consciousness of unspecified duration, initial encounter: Secondary | ICD-10-CM

## 2012-04-17 MED ORDER — TRAMADOL HCL 50 MG PO TABS: 50.0000 mg | ORAL_TABLET | Freq: Four times a day (QID) | ORAL | Status: DC

## 2012-04-17 MED ORDER — HYDROCODONE-ACETAMINOPHEN 10-325 MG PO TABS: 1.0000 | ORAL_TABLET | Freq: Three times a day (TID) | ORAL | Status: DC | PRN

## 2012-04-17 MED ORDER — TOPIRAMATE 50 MG PO TABS: 50.0000 mg | ORAL_TABLET | Freq: Two times a day (BID) | ORAL | Status: DC

## 2012-04-17 MED ORDER — MECLIZINE HCL 25 MG PO TABS: 25.0000 mg | ORAL_TABLET | Freq: Three times a day (TID) | ORAL | Status: DC | PRN

## 2012-04-17 NOTE — Progress Notes (Signed)
Subjective:    Patient ID: Jenna Stafford, female    DOB: 17-Dec-1955, 56 y.o.   MRN: [id number]  HPI  This is a pleasant 56 yo WF referred to me by the [facility] Trauma Service for headaches and sequelae as a result of a TBI. Apparently she fell at home 03/27/12. Her husband came home and found her in bed confused with a bump on her posterior head. He took her to [facility] where she underwent a head CT that showed a brain injury. At the time, she was alert, but slow to respond to commands and answer questions. She was transferred to [facility] for further management. She remained neurologically intact and was admitted to the trauma service with neurosurgery consulting. She doesn't recall events for the first 3 or 4 days after the incident. The first CT available to me done at [facility] on 9/13 revealed:   Increasing focal intraparenchymal hematoma in the right  anterior frontal lobe, now measuring 2.2 cm diameter. There is  edema surrounding this hematoma. Again demonstrated is  subarachnoid hemorrhage in the right frontal and temporal sulci and  along the anterior falx. Small right frontoparietal subdural  hematoma also appears stable. Minimal right to left midline shift  of about 2 mm. Sulci effacement on the right. No intraventricular  hemorrhage. No depressed skull fractures  A later CT scan showed:  Right frontal hemorrhagic contusion has not developed any more  blood accumulation, with maximal diameter of 2.2 cm. There is more  surrounding vasogenic edema.  Other hemorrhagic contusions in the right frontal lobe and right  temporal lobe are present, more evident because of increased  surrounding edema.  Hemorrhagic contusion in the left lateral temporal lobe is more  evident, possibly due to very slightly more blood accumulation but  probably primarily to more notable surrounding edema.  Small amount subarachnoid blood appears the same.  Thin subdural hematomas along the tentorium and  along the convexity  have not enlarged, maximal thickness 4 mm.   Her headaches start in the left eye and radiate over the left half of the head to the neck. She is having headaches 75% of the time.  Reading and any movement of her eyes makes it worse. She complains of vertigo, dizziness, nausea as well. She hasn't noticed any gross visual deficits. She does report "floaters". She hasn't had a formal eye exam. She took tramadol with zofran this morning which she was able to keep down. Hydrocodone also helps. She takes topamax which was a prophylactic for her migraines. She had taken maxalt previously for migraine rescue (migraines with visual aura). Her PCP was managing these before.  From a cognitive standpoint she is having short term memory deficits, concentration issues, etc but these are showing improvement. She is keeping a Software engineer.   She denies any weakness. She had an episode lasting about an hour which her left hand was numb.    Pain Inventory Average Pain 8 Pain Right Now 4 My pain is constant, sharp and stabbing  In the last 24 hours, has pain interfered with the following? General activity 8 Relation with others 0 Enjoyment of life 5 What TIME of day is your pain at its worst? evening Sleep (in general) Fair  Pain is worse with: being tired, strenuous activity, read Pain improves with: pacing activities and medication Relief from Meds: 8  Mobility walk without assistance how many minutes can you walk? 30 as long as balance is ok ability to climb steps?  yes do you drive?  no transfers alone Do you have any goals in this area?  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  yes  Neuro/Psych bowel control problems numbness trouble walking dizziness confusion depression  Prior Studies CT/MRI  Physicians involved in your care Dr  Farris Has   Family History  Problem Relation Age of Onset  . Heart disease Mother   . COPD Mother   . Other Father   . Other Brother    History   Social History  . Marital Status: Married    Spouse Name: N/A    Number of Children: N/A  . Years of Education: N/A   Social History Main Topics  . Smoking status: Current Every Day Smoker -- 0.5 packs/day for 1 years    Types: Cigarettes  . Smokeless tobacco: None  . Alcohol Use: No  . Drug Use: No  . Sexually Active:    Other Topics Concern  . None   Social History Narrative  . None   Past Surgical History  Procedure Date  . Abdominal hysterectomy   . Arthroscopic repair acl     right side  . Cesarean section   . Appendectomy   . Knee surgery     left  . Gastric bypass    Past Medical History  Diagnosis Date  . Depression    BP 125/72  Pulse 69  Resp 14  Ht 5\' 5"  (1.651 m)  Wt 143 lb (64.864 kg)  BMI 23.80 kg/m2  SpO2 99%     Review of Systems  Musculoskeletal: Positive for myalgias, arthralgias and gait problem.  Neurological: Positive for numbness.  Psychiatric/Behavioral: Positive for confusion and dysphoric mood.  All other systems reviewed and are negative.       Objective:   Physical Exam   General: Alert and oriented x 3, No apparent distress HEENT: Head is normocephalic, atraumatic, PERRLA, EOMI, sclera anicteric, oral mucosa pink and moist, dentition intact, TM's , ext ear canals clear including the left,  Neck: Supple without JVD or lymphadenopathy Heart: Reg rate and rhythm. No murmurs rubs or gallops Chest: CTA bilaterally without wheezes, rales, or rhonchi; no distress Abdomen: Soft, non-tender, non-distended, bowel sounds positive. Extremities: No clubbing, cyanosis, or edema. Pulses are 2+ Skin: Clean and intact without signs of breakdown Neuro: Pt is cognitively appropriate with normal insight. STM was fairly functional. She did have difficulty with simple organization (serial  7's, sequencing, etc). CN exam only notable for decreased hearing in the left ear. She had nystagmus with lateral gaze to either side and visual tracking to these areas caused dizziness. Balance with normal gait was reasonable. She had dificulty maintaining balance with heel to toe gait. Strength was grossly 5/5 in all 4 limbs with normal sensation and DTR's.  Musculoskeletal: Full ROM, No pain with AROM or PROM in the neck, trunk, or extremities. Posture appropriate. She was slightly tender to palpation over the fronto-temporal scalp. She had bruising over the left neck anterior to the trapezius. This area was tender to palpation and became more tender when she moved her head and neck as well. Psych: Pt's affect is appropriate. Pt is cooperative        Assessment & Plan:  1. 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 2. BPPV related to the above 3. Hx of migraines with  aura 4. Anemia (acute on chronic?)   Plan: 1. Frankly, it's amazing that Ashonte is doing as well as she is given the magnitude and multitude of her injuries. 2. We will increase her topamax first to 100mg  qhs and ultimately to 150mg  qhs for headache prophylaxis 3. She may continue with a combination of zofran, ultram, or hydrocodone for breakthrough symptoms. She may also use her maxalt 4. Made a referral to vestibular rehab to work on her vertigo and balance. Her oculo-vestibular issues certainly are a trigger for her headaches 5. She should not drive at this time, and she is not ready to return to work. I would suspect she will need to refrain from work for at least a month or two. 6. She may pursue activity as tolerated, symptom permitting 7. Recommend folow up with her PCP regarding her anemia. Her head trauma is not enough to account for the decreased hgb, and this may in part have contributed to her fall 8. I will see her back in a month. I  spent an hour with the patient today.

## 2012-04-17 NOTE — Patient Instructions (Addendum)
Increase your Topamax to 100mg  at night for one week then increase to 150mg  QHS if needed.

## 2012-04-22 ENCOUNTER — Telehealth: Payer: Self-pay | Admitting: Physical Medicine & Rehabilitation

## 2012-04-22 DIAGNOSIS — G43909 Migraine, unspecified, not intractable, without status migrainosus: Secondary | ICD-10-CM

## 2012-04-22 DIAGNOSIS — S06369A Traumatic hemorrhage of cerebrum, unspecified, with loss of consciousness of unspecified duration, initial encounter: Secondary | ICD-10-CM

## 2012-04-22 DIAGNOSIS — S065X9A Traumatic subdural hemorrhage with loss of consciousness of unspecified duration, initial encounter: Secondary | ICD-10-CM

## 2012-04-22 DIAGNOSIS — S069X9A Unspecified intracranial injury with loss of consciousness of unspecified duration, initial encounter: Secondary | ICD-10-CM

## 2012-04-22 NOTE — Telephone Encounter (Signed)
Refill on hydrocodone

## 2012-04-22 NOTE — Telephone Encounter (Signed)
You gave her a refill on 04/17/12 #30. Do you want to refill again. We have not gotten her UDS back yet.

## 2012-04-23 MED ORDER — HYDROCODONE-ACETAMINOPHEN 10-325 MG PO TABS: 1.0000 | ORAL_TABLET | Freq: Three times a day (TID) | ORAL | Status: DC | PRN

## 2012-04-23 NOTE — Telephone Encounter (Signed)
i would be willing to refill #60, but she needs to work on controlling the amount of these she's taking. i would assume she's taking about 5 or 6 per day at this pace

## 2012-04-23 NOTE — Telephone Encounter (Signed)
Rx has been called in per Dr. Riley Kill. Pt is aware.

## 2012-05-17 ENCOUNTER — Encounter: Payer: Self-pay | Admitting: Physical Medicine & Rehabilitation

## 2012-05-17 ENCOUNTER — Encounter: Payer: 59 | Attending: Physical Medicine & Rehabilitation | Admitting: Physical Medicine & Rehabilitation

## 2012-05-17 VITALS — BP 114/49 | HR 77 | Resp 14 | Ht 65.0 in | Wt 140.0 lb

## 2012-05-17 DIAGNOSIS — D649 Anemia, unspecified: Secondary | ICD-10-CM | POA: Insufficient documentation

## 2012-05-17 DIAGNOSIS — S069X9S Unspecified intracranial injury with loss of consciousness of unspecified duration, sequela: Secondary | ICD-10-CM | POA: Insufficient documentation

## 2012-05-17 DIAGNOSIS — G43009 Migraine without aura, not intractable, without status migrainosus: Secondary | ICD-10-CM | POA: Insufficient documentation

## 2012-05-17 DIAGNOSIS — IMO0002 Reserved for concepts with insufficient information to code with codable children: Secondary | ICD-10-CM

## 2012-05-17 DIAGNOSIS — S069XAS Unspecified intracranial injury with loss of consciousness status unknown, sequela: Secondary | ICD-10-CM | POA: Insufficient documentation

## 2012-05-17 DIAGNOSIS — S06369A Traumatic hemorrhage of cerebrum, unspecified, with loss of consciousness of unspecified duration, initial encounter: Secondary | ICD-10-CM

## 2012-05-17 DIAGNOSIS — D62 Acute posthemorrhagic anemia: Secondary | ICD-10-CM

## 2012-05-17 DIAGNOSIS — X58XXXS Exposure to other specified factors, sequela: Secondary | ICD-10-CM | POA: Insufficient documentation

## 2012-05-17 DIAGNOSIS — S069X9A Unspecified intracranial injury with loss of consciousness of unspecified duration, initial encounter: Secondary | ICD-10-CM

## 2012-05-17 DIAGNOSIS — S065X9A Traumatic subdural hemorrhage with loss of consciousness of unspecified duration, initial encounter: Secondary | ICD-10-CM

## 2012-05-17 DIAGNOSIS — F17200 Nicotine dependence, unspecified, uncomplicated: Secondary | ICD-10-CM | POA: Insufficient documentation

## 2012-05-17 DIAGNOSIS — H8110 Benign paroxysmal vertigo, unspecified ear: Secondary | ICD-10-CM | POA: Insufficient documentation

## 2012-05-17 DIAGNOSIS — G43909 Migraine, unspecified, not intractable, without status migrainosus: Secondary | ICD-10-CM

## 2012-05-17 LAB — CBC
HCT: 37.1 % (ref 36.0–46.0)
Hemoglobin: 12.6 g/dL (ref 12.0–15.0)
MCH: 29 pg (ref 26.0–34.0)
MCHC: 34 g/dL (ref 30.0–36.0)
MCV: 85.5 fL (ref 78.0–100.0)
Platelets: 210 10*3/uL (ref 150–400)
RBC: 4.34 MIL/uL (ref 3.87–5.11)
RDW: 13.3 % (ref 11.5–15.5)
WBC: 6.9 10*3/uL (ref 4.0–10.5)

## 2012-05-17 MED ORDER — TRAMADOL HCL 50 MG PO TABS: 50.0000 mg | ORAL_TABLET | Freq: Four times a day (QID) | ORAL | Status: DC

## 2012-05-17 NOTE — Patient Instructions (Signed)
YOU MAY DRIVE AFTER 3 SUCCESSFUL TRIALS WITH A FAMILY MEMBER. YOU MAY ONLY DRIVE LOCALLY AND DURING THE DAY TIME FOR NOW

## 2012-05-17 NOTE — Progress Notes (Signed)
Subjective:    Patient ID: Jenna Stafford, female    DOB: [birth year], 56 y.o.   MRN: [id number]  HPI  Celita is back regarding her TBI. At last visit I sent her PT to assess her BPPV, and she has done very nicely. She has had no vertigo for over a week!  Her sleep is improved. She has come off narcotics and is only using occasional tramadol for pain. The topamax has helped her immensely, and she is taking 50mg  in the am and 100mg  in the pm. She has had no adverse effects with the topamax.  She is anxious to return to driving and to work.  She has tried to be active around the house and with her family.   Pain Inventory Average Pain 7 Pain Right Now 0 My pain is intermittent, sharp and dull  In the last 24 hours, has pain interfered with the following? General activity 4 Relation with others 4 Enjoyment of life 4 What TIME of day is your pain at its worst? evening Sleep (in general) Good  Pain is worse with: walking, bending and some activites Pain improves with: medication Relief from Meds: 10  Mobility walk without assistance how many minutes can you walk? 60 ability to climb steps?  yes do you drive?  yes Do you have any goals in this area?  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  yes  Neuro/Psych No problems in this area  Prior Studies Any changes since last visit?  no  Physicians involved in your care Any changes since last visit?  no   Family History  Problem Relation Age of Onset  . Heart disease Mother   . COPD Mother   . Other Father   . Other Brother    History   Social History  . Marital Status: Married    Spouse Name: N/A    Number of Children: N/A  . Years of Education: N/A   Social History Main Topics  . Smoking status: Current Every Day Smoker -- 0.5 packs/day for 1 years    Types: Cigarettes  . Smokeless tobacco: None  . Alcohol Use: No  . Drug Use: No  . Sexually Active:    Other Topics Concern  .  None   Social History Narrative  . None   Past Surgical History  Procedure Date  . Abdominal hysterectomy   . Arthroscopic repair acl     right side  . Cesarean section   . Appendectomy   . Knee surgery     left  . Gastric bypass    Past Medical History  Diagnosis Date  . Depression    BP 114/49  Pulse 77  Resp 14  Ht 5\' 5"  (1.651 m)  Wt 140 lb (63.504 kg)  BMI 23.30 kg/m2  SpO2 99%     Review of Systems  Musculoskeletal: Positive for myalgias and arthralgias.  All other systems reviewed and are negative.       Objective:   Physical Exam General: Alert and oriented x 3, No apparent distress  HEENT: Head is normocephalic, atraumatic, PERRLA, EOMI, sclera anicteric, oral mucosa pink and moist, dentition intact, TM's , ext ear canals clear including the left,  Neck: Supple without JVD or lymphadenopathy  Heart: Reg rate and rhythm. No murmurs rubs or gallops  Chest: CTA bilaterally without wheezes, rales, or rhonchi; no distress  Abdomen: Soft, non-tender, non-distended, bowel sounds positive.  Extremities:  No clubbing, cyanosis, or edema. Pulses are 2+  Skin: Clean and intact without signs of breakdown  Neuro: Pt is cognitively appropriate with normal insight. STM was fairly functional.  Improved attention and focus. Thoughts were very organized today. Balance with normal gait was reasonable.  . Strength was grossly 5/5 in all 4 limbs with normal sensation and DTR's. CN testing normal  Musculoskeletal: Full ROM, No pain with AROM or PROM in the neck, trunk, or extremities. Posture appropriate.  Psych: Pt's affect is appropriate. Pt is cooperative. In a very positive mood today  Assessment & Plan:   1. 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  2. BPPV related to the above  3. Hx of migraines with aura  4. Anemia (acute on chronic?)    Plan:  1. Kishana has made amazing progress!    2. Continue topamax at 100mg  qhs and 50mg  qam. I would suspect that she could wean this over the next few months. 3. She may continue with ultram for breakthrough pain.  4. Continue with vestibular rehab to work on her vertigo and balance. She has made nice gains with them! 5. Made a referral to Dr. 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  6. She may pursue activity as tolerated, symptom permitting which we reviewed today.   7. Ordered a CBC given her anemia to see if her counts have rebounded. If not, I recommend follow up with PCP. 8. I will see her back in a month. I spent 45 min with the patient today

## 2012-05-18 ENCOUNTER — Telehealth: Payer: Self-pay | Admitting: Physical Medicine & Rehabilitation

## 2012-05-18 NOTE — Telephone Encounter (Signed)
Please let Jenna Stafford know that her hgb is 12.7, which is a nice rebound. She doesn't need to worry about an anemia work-up at this time.  Thanks, Z

## 2012-05-20 NOTE — Telephone Encounter (Signed)
Lm advising patient to call office regarding lab results.

## 2012-05-22 NOTE — Telephone Encounter (Signed)
Patient informed of results.  

## 2012-05-29 ENCOUNTER — Telehealth (HOSPITAL_COMMUNITY): Payer: Self-pay | Admitting: Orthopedic Surgery

## 2012-05-29 ENCOUNTER — Telehealth: Payer: Self-pay | Admitting: *Deleted

## 2012-05-29 NOTE — Telephone Encounter (Signed)
Can Dr Riley Kill call in something to [facility] in [facility] for pain. Has been having migraine type headache for 24hours since last Wednesday and a pain scale of 9 . It is waking her up at night., sharp shooting pains on left side like she had in the hospital. Has been taking hydrocodone ATC. Maxzalt has not helped.  I called and spoke with [name] and told her that Dr Riley Kill was out of the office until  Friday. I told her I could bring her in to see our PA Clydie Braun though she has not seen her before. Jenna Stafford is limited in how far and how much she can drive. I said her options would be to try and call Dr Lonie Peak office and see if they could offer her something or go to an urgent care, or see our PA.  She is going to try Dr Lonie Peak office.  She states that she has been doing so well without any headaches or vertigo in the last few weeks.  She will call us back if she needs to come in to our office.

## 2012-05-30 ENCOUNTER — Telehealth (HOSPITAL_COMMUNITY): Payer: Self-pay | Admitting: Orthopedic Surgery

## 2012-05-30 NOTE — Telephone Encounter (Signed)
Dr. Riley Kill was out of the office this week and she needed refills on medicine. Dr. Wynetta Emery is acutally going to refill meds so she no longer needed anything from me.

## 2012-05-30 NOTE — Telephone Encounter (Signed)
Addressed in another message.

## 2012-05-30 NOTE — Telephone Encounter (Signed)
Noted  

## 2012-06-06 ENCOUNTER — Telehealth: Payer: Self-pay | Admitting: *Deleted

## 2012-06-06 NOTE — Telephone Encounter (Signed)
Patient came into the pharmacy to get a flu shot. Would like to know if it is ok for patient to have. They were asking if she has had any brain injuries which can lead to guillain barre syndrome. Please advise.

## 2012-06-07 NOTE — Telephone Encounter (Signed)
There is no contraindication to her having flu vac

## 2012-06-07 NOTE — Telephone Encounter (Signed)
Called and spoke with [place] at [facility]. Advised her Dr. Riley Kill said it was ok for patient to have Flu Vaccine.

## 2012-06-07 NOTE — Telephone Encounter (Signed)
Message forwarded to Dr. Swartz 

## 2012-06-12 ENCOUNTER — Encounter: Payer: Self-pay | Admitting: Physical Medicine & Rehabilitation

## 2012-06-12 ENCOUNTER — Encounter (HOSPITAL_BASED_OUTPATIENT_CLINIC_OR_DEPARTMENT_OTHER): Payer: 59 | Admitting: Physical Medicine & Rehabilitation

## 2012-06-12 VITALS — BP 111/46 | HR 71 | Resp 14 | Wt 143.0 lb

## 2012-06-12 DIAGNOSIS — G43909 Migraine, unspecified, not intractable, without status migrainosus: Secondary | ICD-10-CM

## 2012-06-12 DIAGNOSIS — W19XXXA Unspecified fall, initial encounter: Secondary | ICD-10-CM

## 2012-06-12 DIAGNOSIS — D62 Acute posthemorrhagic anemia: Secondary | ICD-10-CM

## 2012-06-12 DIAGNOSIS — H8110 Benign paroxysmal vertigo, unspecified ear: Secondary | ICD-10-CM

## 2012-06-12 DIAGNOSIS — S069X9A Unspecified intracranial injury with loss of consciousness of unspecified duration, initial encounter: Secondary | ICD-10-CM

## 2012-06-12 DIAGNOSIS — S06369A Traumatic hemorrhage of cerebrum, unspecified, with loss of consciousness of unspecified duration, initial encounter: Secondary | ICD-10-CM

## 2012-06-12 DIAGNOSIS — S065X9A Traumatic subdural hemorrhage with loss of consciousness of unspecified duration, initial encounter: Secondary | ICD-10-CM

## 2012-06-12 DIAGNOSIS — IMO0002 Reserved for concepts with insufficient information to code with codable children: Secondary | ICD-10-CM

## 2012-06-12 MED ORDER — TOPIRAMATE 50 MG PO TABS: 50.0000 mg | ORAL_TABLET | Freq: Two times a day (BID) | ORAL | Status: DC

## 2012-06-12 MED ORDER — TRAMADOL HCL 50 MG PO TABS: 50.0000 mg | ORAL_TABLET | Freq: Three times a day (TID) | ORAL | Status: DC | PRN

## 2012-06-12 NOTE — Patient Instructions (Signed)
Call me with questions.  Try to move to the tramadol alone for breatkthrough pain

## 2012-06-12 NOTE — Progress Notes (Signed)
Subjective:    Patient ID: Jenna Stafford, female    DOB: 12-Feb-1956, 56 y.o.   MRN: [id number]  HPI  Jenna Stafford is back regarding her TBI. She is finishing up therapy but still has some mild balance issues which are more pronounced when she is fatigued. 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 She notes that fatigue and some positioning tend to exacerbate symptoms at times. She uses the norco still for severe breakthrough pain. Her memory and organization skills have improved quite a bit.  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   Pain Inventory Average Pain 8 Pain Right Now 0 My pain is intermittent, sharp, dull, stabbing and aching  In the last 24 hours, has pain interfered with the following? General activity 0 Relation with others 0 Enjoyment of life 0 What TIME of day is your pain at its worst? daytime and evening Sleep (in general) Fair  Pain is worse with: walking and bending Pain improves with: rest, heat/ice, pacing activities and dark rooms Relief from Meds: 3  Mobility walk without assistance ability to climb steps?  yes  Function not employed: date last employed 03/27/12  Neuro/Psycha numbness loss of taste or smell  Prior Studies Any changes since last visit?  no  Physicians involved in your care Any changes since last visit?  no   Family History  Problem Relation Age of Onset  . Heart disease Mother   . COPD Mother   . Other Father   . Other Brother    History   Social History  . Marital Status: Married    Spouse Name: N/A    Number of Children: N/A  . Years of Education: N/A   Social History Main Topics  . Smoking status: Current Every Day Smoker -- 0.5 packs/day for 1 years    Types: Cigarettes  . Smokeless tobacco: None  . Alcohol Use: No  . Drug Use: No  . Sexually Active:    Other Topics Concern  . None   Social History Narrative  . None   Past Surgical History    Procedure Date  . Abdominal hysterectomy   . Arthroscopic repair acl     right side  . Cesarean section   . Appendectomy   . Knee surgery     left  . Gastric bypass    Past Medical History  Diagnosis Date  . Depression    BP 111/46  Pulse 71  Resp 14  Wt 143 lb (64.864 kg)  SpO2 99%    Review of Systems  Musculoskeletal: Positive for myalgias and arthralgias.  Neurological: Positive for numbness.  All other systems reviewed and are negative.       Objective:   Physical Exam General: Alert and oriented x 3, No apparent distress  HEENT: Head is normocephalic, atraumatic, PERRLA, EOMI, sclera anicteric, oral mucosa pink and moist, dentition intact, TM's , ext ear canals clear including the left,  Neck: Supple without JVD or lymphadenopathy  Heart: Reg rate and rhythm. No murmurs rubs or gallops  Chest: CTA bilaterally without wheezes, rales, or rhonchi; no distress  Abdomen: Soft, non-tender, non-distended, bowel sounds positive.  Extremities: No clubbing, cyanosis, or edema. Pulses are 2+  Skin: Clean and intact without signs of breakdown  Neuro: Pt is cognitively appropriate with normal insight. STM was fairly functional. Good attention and focus. Thoughts were very organized today. Balance with normal gait  was reasonable. . Strength was grossly 5/5 in all 4 limbs with normal sensation and DTR's. CN testing normal  Musculoskeletal: Full ROM, No pain with AROM or PROM in the neck, trunk, or extremities. Posture appropriate.  Psych: Pt's affect is appropriate. Pt is cooperative. In a very positive mood today    Assessment & Plan:   1. 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  2. BPPV related to the above  3. Hx of migraines with aura    Plan:  1. Jenna Stafford has made amazing progress!  2. Continue topamax at 50mg  bid. We can wean this in the new year depending on presentation. 3. She may  continue with ultram or hydrocodone for breakthrough pain. Would like her to move away from the hydrocodone completely 4. Continue with vestibular rehab to work on her vertigo and balance. She continues to make nice gains with them! Asked her to challenge herself at home aw well. 5. She sees Dr. Eula Flax on 06/24/12 for formal cognitive testing. It is my expectation that she should be appropriate to return to work soon. We will likely start with part time-3 days per week initially and ramp upwards thereafter. She has been in her position for some time, and many of her activities are routine which will work in her favor. 6. Continue to  pursue activity as tolerated, symptom permitting which we reviewed today. Discussed pushing herself from a cognitive standpoint. 8. I will see her back in 2 months. I spent 45 min with the patient today

## 2012-06-24 ENCOUNTER — Telehealth: Payer: Self-pay | Admitting: *Deleted

## 2012-06-24 DIAGNOSIS — R41840 Attention and concentration deficit: Secondary | ICD-10-CM

## 2012-06-24 DIAGNOSIS — X58XXXA Exposure to other specified factors, initial encounter: Secondary | ICD-10-CM

## 2012-06-24 DIAGNOSIS — S069X9A Unspecified intracranial injury with loss of consciousness of unspecified duration, initial encounter: Secondary | ICD-10-CM

## 2012-06-24 NOTE — Telephone Encounter (Signed)
Patient needs refill on Hydrocodone sent to [facility] in [facility]

## 2012-06-25 MED ORDER — HYDROCODONE-ACETAMINOPHEN 10-325 MG PO TABS: 1.0000 | ORAL_TABLET | Freq: Three times a day (TID) | ORAL | Status: DC | PRN

## 2012-06-25 NOTE — Telephone Encounter (Signed)
Prescription was verbally called into [facility] in [place]. Patient notified

## 2012-06-25 NOTE — Telephone Encounter (Signed)
Phone in rx written

## 2012-06-25 NOTE — Telephone Encounter (Signed)
Forwarded to Dr. Riley Kill. Is it ok to fill medication?

## 2012-06-27 DIAGNOSIS — R41840 Attention and concentration deficit: Secondary | ICD-10-CM

## 2012-06-27 DIAGNOSIS — X58XXXA Exposure to other specified factors, initial encounter: Secondary | ICD-10-CM

## 2012-06-27 DIAGNOSIS — S069X9A Unspecified intracranial injury with loss of consciousness of unspecified duration, initial encounter: Secondary | ICD-10-CM

## 2012-07-11 ENCOUNTER — Telehealth: Payer: Self-pay

## 2012-07-11 NOTE — Telephone Encounter (Signed)
Patient was told she did well in some areas but the focus and attention was limited.  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  She would like to go back to work doing anything.  She has no income at this time.  She is going to have the results faxed to [place].  Please advise.

## 2012-07-11 NOTE — Telephone Encounter (Signed)
Patient wants to discuss cognitive results with Dr Riley Kill and find out about work restrictions.

## 2012-07-12 ENCOUNTER — Other Ambulatory Visit: Payer: Self-pay | Admitting: Physical Medicine & Rehabilitation

## 2012-07-12 MED ORDER — HYDROCODONE-ACETAMINOPHEN 10-325 MG PO TABS: 1.0000 | ORAL_TABLET | Freq: Three times a day (TID) | ORAL | Status: DC | PRN

## 2012-07-12 MED ORDER — METHYLPHENIDATE HCL ER (OSM) 18 MG PO TBCR: 18.0000 mg | EXTENDED_RELEASE_TABLET | ORAL | Status: DC

## 2012-07-12 NOTE — Telephone Encounter (Signed)
i spoke to patient. We will start concerta. She will pick up along with her hydrocodone today

## 2012-07-23 ENCOUNTER — Other Ambulatory Visit: Payer: Self-pay | Admitting: Physical Medicine & Rehabilitation

## 2012-07-23 NOTE — Telephone Encounter (Signed)
Refill request for hydrocodone from pharmacy.  This was denied because patient should have written script from 07/12/12.  Refill request is to early.  She is supposed to be weaning off.

## 2012-07-24 ENCOUNTER — Telehealth: Payer: Self-pay | Admitting: *Deleted

## 2012-07-24 NOTE — Telephone Encounter (Signed)
Last order for hydrocodone was 07/12/12 for # 30.   Do you want to ok an early refill?

## 2012-07-24 NOTE — Telephone Encounter (Signed)
Needs refill of hydrocodone. Having increased headaches. Her mother is in the hospital in [facility] and having to go back and forth up there. She is going to have to be moved to pallitive care. Maxalt not helping.

## 2012-07-25 NOTE — Telephone Encounter (Signed)
We can refill early. Increase rx to #45

## 2012-07-26 ENCOUNTER — Telehealth: Payer: Self-pay

## 2012-07-26 MED ORDER — HYDROCODONE-ACETAMINOPHEN 10-325 MG PO TABS: 1.0000 | ORAL_TABLET | Freq: Three times a day (TID) | ORAL | Status: DC | PRN

## 2012-07-26 NOTE — Telephone Encounter (Signed)
Patient called complaining of headaches lasting longer than normal.  She is requesting hydrocodone for this, called into [facility] in [place].

## 2012-07-26 NOTE — Telephone Encounter (Signed)
See other message. Ranelle Oyster, MD 07/25/2012 6:08 PM Signed  We can refill early. Increase rx to #45

## 2012-07-26 NOTE — Telephone Encounter (Signed)
Hydrocodone refill called into [facility].  Patient aware.

## 2012-08-12 ENCOUNTER — Encounter: Payer: 59 | Attending: Physical Medicine & Rehabilitation | Admitting: Physical Medicine & Rehabilitation

## 2012-08-12 ENCOUNTER — Encounter: Payer: Self-pay | Admitting: Physical Medicine & Rehabilitation

## 2012-08-12 VITALS — BP 107/55 | HR 69 | Resp 14 | Ht 65.0 in | Wt 138.2 lb

## 2012-08-12 DIAGNOSIS — S065X9A Traumatic subdural hemorrhage with loss of consciousness of unspecified duration, initial encounter: Secondary | ICD-10-CM

## 2012-08-12 DIAGNOSIS — S06369A Traumatic hemorrhage of cerebrum, unspecified, with loss of consciousness of unspecified duration, initial encounter: Secondary | ICD-10-CM

## 2012-08-12 DIAGNOSIS — IMO0002 Reserved for concepts with insufficient information to code with codable children: Secondary | ICD-10-CM

## 2012-08-12 DIAGNOSIS — X58XXXS Exposure to other specified factors, sequela: Secondary | ICD-10-CM | POA: Insufficient documentation

## 2012-08-12 DIAGNOSIS — H8110 Benign paroxysmal vertigo, unspecified ear: Secondary | ICD-10-CM

## 2012-08-12 DIAGNOSIS — S065XAA Traumatic subdural hemorrhage with loss of consciousness status unknown, initial encounter: Secondary | ICD-10-CM | POA: Insufficient documentation

## 2012-08-12 MED ORDER — HYDROCODONE-ACETAMINOPHEN 10-325 MG PO TABS: 1.0000 | ORAL_TABLET | Freq: Three times a day (TID) | ORAL | Status: DC | PRN

## 2012-08-12 MED ORDER — METHYLPHENIDATE HCL 10 MG PO TABS: 10.0000 mg | ORAL_TABLET | Freq: Two times a day (BID) | ORAL | Status: DC

## 2012-08-12 NOTE — Progress Notes (Signed)
Subjective:    Patient ID: Jenna Stafford, female    DOB: 08/17/1955, 57 y.o.   MRN: [id number]  HPI  Jenna Stafford is back regarding her TBI. 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 We initiated concerta to help with her attention with the goal of getting her back to work sooner. She feels that it was quite helpful initially but over the last 2 weeks it hasn't helped as much. This decline has coincided with a period of time where she has had to help provide care for her sick mother.. Her employer requested that she be able to function at full capacity when she returns---therefore she has not gone back to work at any capacity.   She reports continued alterations in her taste and smell. She reports a "uremic" smell at times.   Sleep has not been as good since she has been helping out with her mother. She often has a hard time falling asleep. When her sleep is inadequate she tends to struggle more cognitively the following day as well.     Pain Inventory Average Pain 3 Pain Right Now 3 My pain is stabbing and aching  In the last 24 hours, has pain interfered with the following? General activity 8 Relation with others 8 Enjoyment of life 9 What TIME of day is your pain at its worst? daytime evening and night Sleep (in general) Fair  Pain is worse with: bending and some activites Pain improves with: rest and medication Relief from Meds: 5  Mobility walk without assistance how many minutes can you walk? unlimited ability to climb steps?  yes do you drive?  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  no  Physicians involved in your care Any changes since last visit?  no   Family History  Problem Relation Age of Onset  . Heart disease Mother   . COPD  Mother   . Other Father   . Other Brother    History   Social History  . Marital Status: Married    Spouse Name: N/A    Number of Children: N/A  . Years of Education: N/A   Social History Main Topics  . Smoking status: Former Smoker -- 0.5 packs/day for 1 years    Types: Cigarettes    Quit date: 08/02/2012  . Smokeless tobacco: Never Used  . Alcohol Use: No  . Drug Use: No  . Sexually Active: None   Other Topics Concern  . None   Social History Narrative  . None   Past Surgical History  Procedure Date  . Abdominal hysterectomy   . Arthroscopic repair acl     right side  . Cesarean section   . Appendectomy   . Knee surgery     left  . Gastric bypass    Past Medical History  Diagnosis Date  . Depression    BP 107/55  Pulse 69  Resp 14  Ht 5\' 5"  (1.651 m)  Wt 138 lb 3.2 oz (62.687 kg)  BMI 23.00 kg/m2  SpO2 98%    Review of Systems  Neurological: Positive for dizziness and headaches.  All other systems reviewed and are negative.       Objective:   Physical Exam General: Alert and oriented x 3, No apparent  distress  HEENT: Head is normocephalic, atraumatic, PERRLA, EOMI, sclera anicteric, oral mucosa pink and moist, dentition intact, TM's , ext ear canals clear including the left,  Neck: Supple without JVD or lymphadenopathy  Heart: Reg rate and rhythm. No murmurs rubs or gallops  Chest: CTA bilaterally without wheezes, rales, or rhonchi; no distress  Abdomen: Soft, non-tender, non-distended, bowel sounds positive.  Extremities: No clubbing, cyanosis, or edema. Pulses are 2+  Skin: Clean and intact without signs of breakdown  Neuro: Pt is cognitively appropriate with normal insight. STM was fairly functional. Good attention and focus for the most part with simple conversation. She will occasionally have difficulties finding a word.. Thoughts were very organized today. Balance with normal gait was reasonable. . Strength was grossly 5/5 in all 4 limbs  with normal sensation and DTR's. CN testing normal  Musculoskeletal: Full ROM, No pain with AROM or PROM in the neck, trunk, or extremities. Posture appropriate.  Psych: Pt's affect is appropriate. Pt is cooperative. Good spirits.     Assessment & Plan:   1. 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  2. BPPV related to the above  3. Hx of migraines with aura   Plan:  1. Reviewed smell and taste issues which are likely related to her TBI predominantly but the topamax may be contributing 2. Continue topamax at 50mg  bid. This continues to be effective for headaches.  3. She may continue with ultram or hydrocodone for breakthrough pain. Would like her to move away from the hydrocodone completely  4. Will change her to ritalin immediate release given her absorption, 10mg  bid.  5. Reviewed sleep patterns. It would be ok to use an occasional benadryl to help get her to sleep if she feels restless. I am also ok with her taking 2 of the melatonin OTC's. 6. Continue to pursue activity as tolerated. 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 She will look around for options.  8. I will see her back in 2 months. I spent 30 min with the patient today

## 2012-08-12 NOTE — Patient Instructions (Signed)
TAKE YOUR RITALIN AT 0700 AND 1200 DAILY  MAKE SURE YOU GET ENOUGH SLEEP. TAKE AN OCCASIONAL BENADRYL TO HELP YOU REST.   YOU MAY TAKE 2 MELATONINS

## 2012-08-16 ENCOUNTER — Telehealth: Payer: Self-pay

## 2012-08-16 NOTE — Telephone Encounter (Signed)
Patient is trying to find a job and get paid for when she was unemployed.  She needs to find out when she was able to start back to work.  She needs a date to give unemployment.

## 2012-08-20 NOTE — Telephone Encounter (Signed)
She may begin now as far as i'm concerned (and if she's ok with that) as long as there aren't any potential safety risk factors inherent to the job as they pertain to her or clients/co-workers

## 2012-08-22 NOTE — Telephone Encounter (Signed)
Patient informed and will persue her options at this point.

## 2012-08-26 ENCOUNTER — Other Ambulatory Visit: Payer: Self-pay | Admitting: Physical Medicine & Rehabilitation

## 2012-09-02 ENCOUNTER — Other Ambulatory Visit: Payer: Self-pay | Admitting: Physical Medicine & Rehabilitation

## 2012-09-04 ENCOUNTER — Telehealth: Payer: Self-pay

## 2012-09-04 DIAGNOSIS — S065X9A Traumatic subdural hemorrhage with loss of consciousness of unspecified duration, initial encounter: Secondary | ICD-10-CM

## 2012-09-04 DIAGNOSIS — S06369A Traumatic hemorrhage of cerebrum, unspecified, with loss of consciousness of unspecified duration, initial encounter: Secondary | ICD-10-CM

## 2012-09-04 NOTE — Telephone Encounter (Signed)
Patient called needing hydrocodone refill.  She also needs another referral for congnative testing.

## 2012-09-06 ENCOUNTER — Telehealth: Payer: Self-pay

## 2012-09-06 DIAGNOSIS — S06369A Traumatic hemorrhage of cerebrum, unspecified, with loss of consciousness of unspecified duration, initial encounter: Secondary | ICD-10-CM

## 2012-09-06 DIAGNOSIS — S065X9A Traumatic subdural hemorrhage with loss of consciousness of unspecified duration, initial encounter: Secondary | ICD-10-CM

## 2012-09-06 MED ORDER — HYDROCODONE-ACETAMINOPHEN 10-325 MG PO TABS: 1.0000 | ORAL_TABLET | Freq: Three times a day (TID) | ORAL | Status: DC | PRN

## 2012-09-06 NOTE — Telephone Encounter (Signed)
Needs new referral for the cognitive therapy since she has been put on the ritalin.  Referral re ordered to [facility], and med refilled.

## 2012-09-06 NOTE — Telephone Encounter (Signed)
She had a referral in the system from November but it was to Dr Leonides Cave.  Isn't he no longer with [facility] so would she need a new referral?

## 2012-09-06 NOTE — Telephone Encounter (Signed)
He is still there as far as I know. He would have informed me if otherwise

## 2012-09-06 NOTE — Telephone Encounter (Signed)
[facility]codone called into [facility].

## 2012-09-13 DIAGNOSIS — S069X9A Unspecified intracranial injury with loss of consciousness of unspecified duration, initial encounter: Secondary | ICD-10-CM

## 2012-09-13 DIAGNOSIS — R41840 Attention and concentration deficit: Secondary | ICD-10-CM

## 2012-09-13 DIAGNOSIS — X58XXXA Exposure to other specified factors, initial encounter: Secondary | ICD-10-CM

## 2012-09-17 ENCOUNTER — Telehealth: Payer: Self-pay | Admitting: *Deleted

## 2012-09-17 NOTE — Telephone Encounter (Signed)
ERNA BROSSARD saw Dr Leonides Cave Friday for her cognitive testing.  She has improved.  Dr Leonides Cave should be faxing notes over to Dr Riley Kill.

## 2012-09-17 NOTE — Telephone Encounter (Signed)
i will be on the look out

## 2012-09-19 ENCOUNTER — Telehealth: Payer: Self-pay

## 2012-09-19 NOTE — Telephone Encounter (Signed)
Patient called to get cognitive testing results

## 2012-09-19 NOTE — Telephone Encounter (Signed)
i have not received them. May want to contact Dr. Leonides Cave regarding these. He has not given me any insight regarding the results yet.

## 2012-09-23 ENCOUNTER — Telehealth: Payer: Self-pay | Admitting: Physical Medicine & Rehabilitation

## 2012-09-23 DIAGNOSIS — S06369A Traumatic hemorrhage of cerebrum, unspecified, with loss of consciousness of unspecified duration, initial encounter: Secondary | ICD-10-CM

## 2012-09-23 DIAGNOSIS — S065X9D Traumatic subdural hemorrhage with loss of consciousness of unspecified duration, subsequent encounter: Secondary | ICD-10-CM

## 2012-09-23 MED ORDER — METHYLPHENIDATE HCL 10 MG PO TABS: 10.0000 mg | ORAL_TABLET | Freq: Two times a day (BID) | ORAL | Status: DC

## 2012-09-23 NOTE — Telephone Encounter (Signed)
Requesting refill of Ritalin

## 2012-09-23 NOTE — Telephone Encounter (Signed)
i called patient and discussed testing. i am allowing her to return to work with certain stipulations. She will contact her boss and ask what he needs of me as far as documentation is concerned.

## 2012-09-23 NOTE — Telephone Encounter (Signed)
Ritalin script printed to be signed.  Contact patient when ready.

## 2012-09-24 NOTE — Telephone Encounter (Signed)
Patient aware ritalin script is ready.  Her husband will pick up.

## 2012-09-25 ENCOUNTER — Telehealth: Payer: Self-pay | Admitting: *Deleted

## 2012-09-25 NOTE — Telephone Encounter (Signed)
[facility].  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  She needs to speak with Dr Riley Kill about Jenna Stafford coming back.  Would like a call to speak about specifics. Leaving for today but will be back tomorrow 630-2.

## 2012-09-25 NOTE — Telephone Encounter (Signed)
i can speak to her tomorrow. What is the phone number.  Can you text it to me?

## 2012-09-26 ENCOUNTER — Telehealth: Payer: Self-pay

## 2012-09-26 NOTE — Telephone Encounter (Signed)
Patient has questions regarding going back to work.  She is not going to be able to go back to her original job.  Patient is wanting to fine another job.  Is patient aloud to go back to any position?  What are her limitations?  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  Patient feels comfortable doing this.  She just wanted to make sure she was not limited to just her original job.  Patient will make an appointment to discuss her options with the doctor.

## 2012-09-26 NOTE — Telephone Encounter (Signed)
Patient called and said her job does not have a full time positions open for her and she is interested in other employment.  She is going to make an appointment to discuss.  Diane her directors number is [phone] or [phone]

## 2012-09-27 NOTE — Telephone Encounter (Signed)
So i will assume by this, that  We'll wait and discuss at her visit.? i would think that we shouldn't be discussing with her supervisor if she's not going to go back to that position!

## 2012-10-02 ENCOUNTER — Telehealth: Payer: Self-pay

## 2012-10-02 NOTE — Telephone Encounter (Signed)
Patient is going back to work at [employer]?  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  She needs a letter of medical release faxed to [phone] attn Farrel Gobble.

## 2012-10-02 NOTE — Telephone Encounter (Signed)
Done, i will bring letter in with me on Friday

## 2012-10-08 ENCOUNTER — Encounter: Payer: 59 | Attending: Physical Medicine & Rehabilitation | Admitting: Physical Medicine & Rehabilitation

## 2012-10-08 ENCOUNTER — Encounter: Payer: Self-pay | Admitting: Physical Medicine & Rehabilitation

## 2012-10-08 VITALS — BP 104/66 | HR 69 | Resp 16 | Ht 65.0 in | Wt 138.0 lb

## 2012-10-08 DIAGNOSIS — S065X9D Traumatic subdural hemorrhage with loss of consciousness of unspecified duration, subsequent encounter: Secondary | ICD-10-CM

## 2012-10-08 DIAGNOSIS — S06369A Traumatic hemorrhage of cerebrum, unspecified, with loss of consciousness of unspecified duration, initial encounter: Secondary | ICD-10-CM

## 2012-10-08 DIAGNOSIS — X58XXXS Exposure to other specified factors, sequela: Secondary | ICD-10-CM | POA: Insufficient documentation

## 2012-10-08 DIAGNOSIS — Z79899 Other long term (current) drug therapy: Secondary | ICD-10-CM | POA: Insufficient documentation

## 2012-10-08 DIAGNOSIS — S06309A Unspecified focal traumatic brain injury with loss of consciousness of unspecified duration, initial encounter: Secondary | ICD-10-CM

## 2012-10-08 DIAGNOSIS — H8110 Benign paroxysmal vertigo, unspecified ear: Secondary | ICD-10-CM

## 2012-10-08 DIAGNOSIS — Z5189 Encounter for other specified aftercare: Secondary | ICD-10-CM | POA: Insufficient documentation

## 2012-10-08 DIAGNOSIS — Z5181 Encounter for therapeutic drug level monitoring: Secondary | ICD-10-CM

## 2012-10-08 MED ORDER — METHYLPHENIDATE HCL 10 MG PO TABS: 10.0000 mg | ORAL_TABLET | Freq: Two times a day (BID) | ORAL | Status: DC

## 2012-10-08 MED ORDER — HYDROCODONE-ACETAMINOPHEN 5-325 MG PO TABS: 1.0000 | ORAL_TABLET | Freq: Four times a day (QID) | ORAL | Status: DC | PRN

## 2012-10-08 NOTE — Patient Instructions (Signed)
CALL ME WITH ANY PROBLEMS OR QUESTIONS (#[phone]).  HAVE A GOOD DAY  

## 2012-10-08 NOTE — Progress Notes (Signed)
Subjective:    Patient ID: Jenna Stafford, female    DOB: Mar 06, 1956, 57 y.o.   MRN: [id number]  HPI  Jenna Stafford is back regarding her TBI.  She has been doing quite well with the ritalin. She is about to start a new job. 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 The company is named [employer]  She will be starting the job shortly. She has experience with this population previously.   Her headaches are under reasonable control. The tramadol seems to be doing the job. She rarely has any vestibular events, except with extreme changes in head position.   She had a cap come off one of her teeth leaving her teeth exposed.   Her sleep is 6-8 hours per night.   The ritalin continues to help with attention and arousal.  Pain Inventory Average Pain 0 Pain Right Now 0 My pain is constant, sharp, dull and aching  In the last 24 hours, has pain interfered with the following? General activity n/a Relation with others n/a Enjoyment of life n/a What TIME of day is your pain at its worst? evening,night time Sleep (in general) Good  Pain is worse with: walking and bending Pain improves with: n/a Relief from Meds: 9  Mobility walk without assistance how many minutes can you walk? unlimited  ability to climb steps?  yes do you drive?  yes Do you have any goals in this area?  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  yes  Neuro/Psych dizziness loss of taste or smell  Prior Studies Any changes since last visit?  no  Physicians involved in your care Any changes since last visit?  no   Family History  Problem Relation Age of Onset  . Heart disease Mother   . COPD Mother   . Other Father   . Other Brother    History   Social History  . Marital Status: Married    Spouse Name: N/A    Number of Children: N/A  . Years of Education: N/A   Social History Main  Topics  . Smoking status: Former Smoker -- 0.50 packs/day for 1 years    Types: Cigarettes    Quit date: 08/02/2012  . Smokeless tobacco: Never Used  . Alcohol Use: No  . Drug Use: No  . Sexually Active: None   Other Topics Concern  . None   Social History Narrative  . None   Past Surgical History  Procedure Laterality Date  . Abdominal hysterectomy    . Arthroscopic repair acl      right side  . Cesarean section    . Appendectomy    . Knee surgery      left  . Gastric bypass     Past Medical History  Diagnosis Date  . Depression    BP 104/66  Pulse 69  Resp 16  Ht 5\' 5"  (1.651 m)  Wt 138 lb (62.596 kg)  BMI 22.96 kg/m2  SpO2 100%      Review of Systems  Constitutional: Positive for appetite change.  Neurological: Positive for dizziness.  All other systems reviewed and are negative.       Objective:   Physical Exam General: Alert and oriented x 3, No apparent distress  HEENT: Head is normocephalic, atraumatic, PERRLA, EOMI, sclera anicteric, oral mucosa pink and moist, dentition  intact, TM's , ext ear canals clear including the left,  Neck: Supple without JVD or lymphadenopathy  Heart: Reg rate and rhythm. No murmurs rubs or gallops  Chest: CTA bilaterally without wheezes, rales, or rhonchi; no distress  Abdomen: Soft, non-tender, non-distended, bowel sounds positive.  Extremities: No clubbing, cyanosis, or edema. Pulses are 2+  Skin: Clean and intact without signs of breakdown  Neuro: Pt is cognitively appropriate with normal insight. STM was fairly functional. Good attention and focus for the most part with simple conversation.. Thoughts were very organized today. Balance with normal gait was reasonable. . Strength was grossly 5/5 in all 4 limbs with normal sensation and DTR's. CN testing normal  Musculoskeletal: Full ROM, No pain with AROM or PROM in the neck, trunk, or extremities. Posture appropriate.  Psych: Pt's affect is appropriate. Pt is  cooperative. Good spirits.   Assessment & Plan:   1. 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  2. BPPV related to the above  3. Hx of migraines with aura   Plan:  1. Jenna Stafford has clearance to return to work. i asked her to be sensible about her activities and work load.  2. Continue topamax at 50mg  bid. This continues to be effective for headaches.  3. She may continue with ultram or hydrocodone for breakthrough pain. Gave her an rx for hydrocodne 5/325 for more sever pain if needed. 4. Continue ritalin immediate release given her absorption, 10mg  bid.  5. Reviewed the importance of regular sleep, preferably at least 8 hours per night.  6.  . I will see her back in 3 months. I spent 30 min with the patient today

## 2012-10-08 NOTE — Addendum Note (Signed)
Addended by: Judd Gaudier on: 10/08/2012 01:35 PM   Modules accepted: Orders

## 2012-10-28 ENCOUNTER — Other Ambulatory Visit: Payer: Self-pay | Admitting: Physical Medicine & Rehabilitation

## 2012-11-15 ENCOUNTER — Telehealth: Payer: Self-pay | Admitting: *Deleted

## 2012-11-15 DIAGNOSIS — S06369A Traumatic hemorrhage of cerebrum, unspecified, with loss of consciousness of unspecified duration, initial encounter: Secondary | ICD-10-CM

## 2012-11-15 DIAGNOSIS — S065X9D Traumatic subdural hemorrhage with loss of consciousness of unspecified duration, subsequent encounter: Secondary | ICD-10-CM

## 2012-11-15 MED ORDER — METHYLPHENIDATE HCL 10 MG PO TABS: 10.0000 mg | ORAL_TABLET | Freq: Three times a day (TID) | ORAL | Status: DC

## 2012-11-15 NOTE — Telephone Encounter (Signed)
That would be fine. Her next rx will need to reflect that change

## 2012-11-15 NOTE — Telephone Encounter (Signed)
Jenna Stafford is wanting to know if it will be ok for her to take her Ritalin 20 mg in am and 10 mg at noon. She does well taking 20 mg in am.

## 2012-11-15 NOTE — Telephone Encounter (Signed)
Notified patient. She has rx at pharmacy. She will fill that and take 3/day (2 in am and 1 at noon) and when out the new rx will need to read Ritalin 20mg  in am and 10 mg at noon # 90. Changed in medication file but no rx printed or given on this date.

## 2012-12-03 ENCOUNTER — Other Ambulatory Visit: Payer: Self-pay | Admitting: Physical Medicine & Rehabilitation

## 2012-12-30 ENCOUNTER — Telehealth: Payer: Self-pay

## 2012-12-30 DIAGNOSIS — S06369A Traumatic hemorrhage of cerebrum, unspecified, with loss of consciousness of unspecified duration, initial encounter: Secondary | ICD-10-CM

## 2012-12-30 DIAGNOSIS — S065X9D Traumatic subdural hemorrhage with loss of consciousness of unspecified duration, subsequent encounter: Secondary | ICD-10-CM

## 2012-12-30 MED ORDER — METHYLPHENIDATE HCL 10 MG PO TABS: 10.0000 mg | ORAL_TABLET | Freq: Three times a day (TID) | ORAL | Status: DC

## 2012-12-30 NOTE — Telephone Encounter (Signed)
Patient called to get ritalin refill.  She says she takes 2 10mg  tablets in the am and 1 10mg  tablet in the afternoon.  Script printed.  Will contact patient when signed.

## 2012-12-31 NOTE — Telephone Encounter (Signed)
Left message advising patient ritatlin script is ready for pick up.

## 2013-01-08 ENCOUNTER — Encounter: Payer: 59 | Attending: Physical Medicine & Rehabilitation | Admitting: Physical Medicine & Rehabilitation

## 2013-01-08 ENCOUNTER — Encounter: Payer: Self-pay | Admitting: Physical Medicine & Rehabilitation

## 2013-01-08 VITALS — BP 112/47 | HR 69 | Resp 16 | Ht 65.0 in | Wt 137.0 lb

## 2013-01-08 DIAGNOSIS — S065X9S Traumatic subdural hemorrhage with loss of consciousness of unspecified duration, sequela: Secondary | ICD-10-CM

## 2013-01-08 DIAGNOSIS — S069X9S Unspecified intracranial injury with loss of consciousness of unspecified duration, sequela: Secondary | ICD-10-CM | POA: Insufficient documentation

## 2013-01-08 DIAGNOSIS — Z5189 Encounter for other specified aftercare: Secondary | ICD-10-CM

## 2013-01-08 DIAGNOSIS — F32A Depression, unspecified: Secondary | ICD-10-CM

## 2013-01-08 DIAGNOSIS — G43109 Migraine with aura, not intractable, without status migrainosus: Secondary | ICD-10-CM | POA: Insufficient documentation

## 2013-01-08 DIAGNOSIS — S069XAS Unspecified intracranial injury with loss of consciousness status unknown, sequela: Secondary | ICD-10-CM | POA: Insufficient documentation

## 2013-01-08 DIAGNOSIS — S065X9D Traumatic subdural hemorrhage with loss of consciousness of unspecified duration, subsequent encounter: Secondary | ICD-10-CM

## 2013-01-08 DIAGNOSIS — X58XXXS Exposure to other specified factors, sequela: Secondary | ICD-10-CM | POA: Insufficient documentation

## 2013-01-08 DIAGNOSIS — S06309A Unspecified focal traumatic brain injury with loss of consciousness of unspecified duration, initial encounter: Secondary | ICD-10-CM

## 2013-01-08 DIAGNOSIS — S06369D Traumatic hemorrhage of cerebrum, unspecified, with loss of consciousness of unspecified duration, subsequent encounter: Secondary | ICD-10-CM

## 2013-01-08 DIAGNOSIS — G43909 Migraine, unspecified, not intractable, without status migrainosus: Secondary | ICD-10-CM

## 2013-01-08 DIAGNOSIS — H8110 Benign paroxysmal vertigo, unspecified ear: Secondary | ICD-10-CM | POA: Insufficient documentation

## 2013-01-08 DIAGNOSIS — F329 Major depressive disorder, single episode, unspecified: Secondary | ICD-10-CM

## 2013-01-08 DIAGNOSIS — S06369A Traumatic hemorrhage of cerebrum, unspecified, with loss of consciousness of unspecified duration, initial encounter: Secondary | ICD-10-CM

## 2013-01-08 MED ORDER — METHYLPHENIDATE HCL 10 MG PO TABS: 10.0000 mg | ORAL_TABLET | Freq: Three times a day (TID) | ORAL | Status: DC

## 2013-01-08 NOTE — Patient Instructions (Signed)
TRY STOPPING TOPAMAX WHEN YOU HAVE SOME TIME OFF.  WE CAN CONSIDER DECREASING THE CELEXA AS WELL LATER THIS YEAR.

## 2013-01-08 NOTE — Progress Notes (Signed)
Subjective:    Patient ID: Jenna Stafford, female    DOB: 08/17/1955, 57 y.o.   MRN: [id number]  HPI  Jenna Stafford is back regarding her head trauma. She is working two part time jobs, the second of which requires driving to [place]. 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 She's working around 30 hours per week.   She sleeps around 7 hours per night. She does get sleepy when she drive occasionally but it is rare. She will pull off the road if she needs to.  Her headaches are controlled with the ultram generally. She may still get a headache when she's stressed or overworked.   Jenna Stafford notes problems with her libido since the injury as well and questions when that will come back.   Pain Inventory Average Pain 0 Pain Right Now 0 My pain is na  In the last 24 hours, has pain interfered with the following? General activity na Relation with others na Enjoyment of life na What TIME of day is your pain at its worst? morning,day time Sleep (in general) Good  Pain is worse with: walking, bending and some activites Pain improves with: rest, pacing activities and medication Relief from Meds: 10  Mobility Do you have any goals in this area?  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  no  Neuro/Psych dizziness loss of taste or smell  Prior Studies Any changes since last visit?  no  Physicians involved in your care Any changes since last visit?  no   Family History  Problem Relation Age of Onset  . Heart disease Mother   . COPD Mother   . Other Father   . Other Brother    History   Social History  . Marital Status: Married    Spouse Name: N/A    Number of Children: N/A  . Years of Education: N/A   Social History Main Topics  . Smoking status: Former Smoker -- 0.50 packs/day for 1 years    Types: Cigarettes    Quit date: 08/02/2012  . Smokeless tobacco: Never Used  . Alcohol Use: No   . Drug Use: No  . Sexually Active: None   Other Topics Concern  . None   Social History Narrative  . None   Past Surgical History  Procedure Laterality Date  . Abdominal hysterectomy    . Arthroscopic repair acl      right side  . Cesarean section    . Appendectomy    . Knee surgery      left  . Gastric bypass     Past Medical History  Diagnosis Date  . Depression    BP 112/47  Pulse 69  Resp 16  Ht 5\' 5"  (1.651 m)  Wt 137 lb (62.143 kg)  BMI 22.8 kg/m2  SpO2 99%     Review of Systems  Neurological: Positive for dizziness.  All other systems reviewed and are negative.       Objective:   Physical Exam General: Alert and oriented x 3, No apparent distress  HEENT: Head is normocephalic, atraumatic, PERRLA, EOMI, sclera anicteric, oral mucosa pink and moist, dentition intact, TM's , ext ear canals clear including the left,  Neck: Supple without JVD or lymphadenopathy  Heart: Reg rate and rhythm. No murmurs rubs or gallops  Chest: CTA bilaterally without wheezes, rales, or rhonchi; no distress  Abdomen: Soft,  non-tender, non-distended, bowel sounds positive.  Extremities: No clubbing, cyanosis, or edema. Pulses are 2+  Skin: Clean and intact without signs of breakdown  Neuro: Pt is cognitively appropriate with normal insight. STM was fairly functional. Good attention and focus for the most part with simple conversation.. Thoughts were very organized today. Balance with normal gait was reasonable. . Strength was grossly 5/5 in all 4 limbs with normal sensation and DTR's. CN testing normal  Musculoskeletal: Full ROM, No pain with AROM or PROM in the neck, trunk, or extremities. Posture appropriate.  Psych: Pt's affect is appropriate. Pt is cooperative. Good spirits.    Assessment & Plan:   1. 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  2. BPPV related to the above  3. Hx of migraines  with aura    Plan:  1. Jenna Stafford has clearance to return to work. i asked her to be sensible about her activities and work load.  2. Continue topamax at 50mg  qhs. Will attempt to stop it when she has time off on vacation to see if it helps her attention, energy levels. Could be affecting libido as well. Consider decreasing celexa also.  3. Tramadol for breakthrough pain.   4. Continue ritalin immediate release given her absorption, 10mg  bid.  5. Reviewed the importance of regular sleep, preferably at least 8 hours per night. ---I'll accept the 7 hours she's getting however.  6. . I will see her back in 3 months. I spent 30 min with the patient today

## 2013-01-09 ENCOUNTER — Telehealth: Payer: Self-pay

## 2013-01-09 DIAGNOSIS — S06369A Traumatic hemorrhage of cerebrum, unspecified, with loss of consciousness of unspecified duration, initial encounter: Secondary | ICD-10-CM

## 2013-01-09 DIAGNOSIS — S065X9D Traumatic subdural hemorrhage with loss of consciousness of unspecified duration, subsequent encounter: Secondary | ICD-10-CM

## 2013-01-09 NOTE — Telephone Encounter (Signed)
Patient called requesting hydrocodone.

## 2013-01-10 ENCOUNTER — Other Ambulatory Visit: Payer: Self-pay | Admitting: Physical Medicine & Rehabilitation

## 2013-01-10 MED ORDER — HYDROCODONE-ACETAMINOPHEN 5-325 MG PO TABS: 1.0000 | ORAL_TABLET | Freq: Four times a day (QID) | ORAL | Status: DC | PRN

## 2013-01-10 NOTE — Telephone Encounter (Signed)
i specifically asked her and she said she wasn't needing the hydrocodone any more.  i rx'ed a phone-in refill

## 2013-01-10 NOTE — Telephone Encounter (Signed)
Jenna Stafford requested refill on her hydrocodone but you saw her on 01/08/13 and note says tramadol for breakthrough pain.  Do you want to refill this request?

## 2013-01-10 NOTE — Telephone Encounter (Signed)
Hydrocodone called into [facility].  Patient informed.

## 2013-04-04 ENCOUNTER — Ambulatory Visit: Payer: 59 | Admitting: Physical Medicine & Rehabilitation

## 2013-04-22 ENCOUNTER — Encounter: Payer: Self-pay | Admitting: Physical Medicine & Rehabilitation

## 2013-04-22 ENCOUNTER — Encounter: Payer: 59 | Attending: Physical Medicine & Rehabilitation | Admitting: Physical Medicine & Rehabilitation

## 2013-04-22 VITALS — BP 112/55 | HR 74 | Resp 14 | Ht 65.0 in | Wt 130.0 lb

## 2013-04-22 DIAGNOSIS — Z5189 Encounter for other specified aftercare: Secondary | ICD-10-CM

## 2013-04-22 DIAGNOSIS — X58XXXA Exposure to other specified factors, initial encounter: Secondary | ICD-10-CM | POA: Insufficient documentation

## 2013-04-22 DIAGNOSIS — Z5181 Encounter for therapeutic drug level monitoring: Secondary | ICD-10-CM

## 2013-04-22 DIAGNOSIS — S065X9S Traumatic subdural hemorrhage with loss of consciousness of unspecified duration, sequela: Secondary | ICD-10-CM

## 2013-04-22 DIAGNOSIS — S06369A Traumatic hemorrhage of cerebrum, unspecified, with loss of consciousness of unspecified duration, initial encounter: Secondary | ICD-10-CM

## 2013-04-22 DIAGNOSIS — S06330A Contusion and laceration of cerebrum, unspecified, without loss of consciousness, initial encounter: Secondary | ICD-10-CM | POA: Insufficient documentation

## 2013-04-22 DIAGNOSIS — S065X9D Traumatic subdural hemorrhage with loss of consciousness of unspecified duration, subsequent encounter: Secondary | ICD-10-CM

## 2013-04-22 DIAGNOSIS — Z79899 Other long term (current) drug therapy: Secondary | ICD-10-CM

## 2013-04-22 DIAGNOSIS — H8110 Benign paroxysmal vertigo, unspecified ear: Secondary | ICD-10-CM

## 2013-04-22 DIAGNOSIS — G43909 Migraine, unspecified, not intractable, without status migrainosus: Secondary | ICD-10-CM | POA: Insufficient documentation

## 2013-04-22 DIAGNOSIS — S06309A Unspecified focal traumatic brain injury with loss of consciousness of unspecified duration, initial encounter: Secondary | ICD-10-CM

## 2013-04-22 DIAGNOSIS — Z87820 Personal history of traumatic brain injury: Secondary | ICD-10-CM | POA: Insufficient documentation

## 2013-04-22 DIAGNOSIS — S069X9S Unspecified intracranial injury with loss of consciousness of unspecified duration, sequela: Secondary | ICD-10-CM

## 2013-04-22 MED ORDER — METHYLPHENIDATE HCL 10 MG PO TABS: 10.0000 mg | ORAL_TABLET | Freq: Three times a day (TID) | ORAL | Status: DC

## 2013-04-22 MED ORDER — RIZATRIPTAN BENZOATE 10 MG PO TBDP: 10.0000 mg | ORAL_TABLET | ORAL | Status: DC | PRN

## 2013-04-22 NOTE — Patient Instructions (Signed)
PLEASE MAKE SURE YOU'RE GETTING ADEQUATE SLEEP.

## 2013-04-22 NOTE — Progress Notes (Signed)
Subjective:    Patient ID: Jenna Stafford, female    DOB: October 02, 1955, 57 y.o.   MRN: [id number]  HPI  Jenna Stafford is back regarding her TBI and post-concussion syndrome. Other than her father n law passing recently she has had an uneventful summer.   She is continuing to work but her hours are inconsistent and can substantially fluctuate. She has to drive quite a bit. Her hours may fluctuate from 40 to 72 hours.   She has had three bad headaches in the last week which she thinks are related to allergies. She ultimately took her maxalt which helped stop the headache.      Pain Inventory Average Pain 8 Pain Right Now 8 My pain is dull  In the last 24 hours, has pain interfered with the following? General activity 10 Relation with others 10 Enjoyment of life 10 What TIME of day is your pain at its worst? all Sleep (in general) Fair  Pain is worse with: walking and bending Pain improves with: rest and medication Relief from Meds: 8  Mobility walk without assistance how many minutes can you walk? unlimited ability to climb steps?  yes do you drive?  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  no  Physicians involved in your care Any changes since last visit?  no   Family History  Problem Relation Age of Onset  . Heart disease Mother   . COPD Mother   . Other Father   . Other Brother    History   Social History  . Marital Status: Married    Spouse Name: N/A    Number of Children: N/A  . Years of Education: N/A   Social History Main Topics  . Smoking status: Former Smoker -- 0.50 packs/day for 1 years    Types: Cigarettes    Quit date: 08/02/2012  . Smokeless tobacco: Never Used  . Alcohol Use: No  . Drug Use: No  . Sexual Activity: None   Other Topics Concern  . None   Social History Narrative  . None   Past Surgical  History  Procedure Laterality Date  . Abdominal hysterectomy    . Arthroscopic repair acl      right side  . Cesarean section    . Appendectomy    . Knee surgery      left  . Gastric bypass     Past Medical History  Diagnosis Date  . Depression    BP 112/55  Pulse 74  Resp 14  Ht 5\' 5"  (1.651 m)  Wt 130 lb (58.968 kg)  BMI 21.63 kg/m2  SpO2 100%    Review of Systems  HENT:       Lost of smell and taste  Neurological: Positive for dizziness.  Psychiatric/Behavioral: Positive for dysphoric mood.  All other systems reviewed and are negative.       Objective:   Physical Exam General: Alert and oriented x 3, No apparent distress  HEENT: Head is normocephalic, atraumatic, PERRLA, EOMI, sclera anicteric, oral mucosa pink and moist, dentition intact, TM's , ext ear canals clear including the left,  Neck: Supple without JVD or lymphadenopathy  Heart: Reg rate and rhythm. No murmurs rubs or gallops  Chest: CTA bilaterally without wheezes, rales, or rhonchi; no distress  Abdomen: Soft, non-tender, non-distended, bowel sounds positive.  Extremities: No clubbing, cyanosis, or  edema. Pulses are 2+  Skin: Clean and intact without signs of breakdown  Neuro: Pt is cognitively appropriate with normal insight. STM was fairly functional. Good attention and focus for the most part with simple conversation.. Thoughts were very organized today. Balance with normal gait was reasonable. . Strength was grossly 5/5 in all 4 limbs with normal sensation and DTR's. CN testing normal  Musculoskeletal: Full ROM, No pain with AROM or PROM in the neck, trunk, or extremities. Posture appropriate.  Psych: Pt's affect is appropriate. Pt is cooperative. Good spirits.    Assessment & Plan:   1. 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  2. BPPV related to the above  3. Hx of migraines with aura, headaches are also  post-traumatic. May have stress and sinus contributions as well.   Plan:  1.Recommended more regular use of her maxalt for breakthrough migraines. Discussed the fact that she needs to watch her emotional and physical stress levels as well. 2. Continue topamax at 50mg  qhs.   3. Tramadol for breakthrough pain.  4. Continue ritalin immediate release given her absorption, 10mg  bid. With a second rx for next month written  6. . I will see her back in 3 months. I spent 30 min with the patient today

## 2013-06-19 ENCOUNTER — Other Ambulatory Visit: Payer: Self-pay | Admitting: Physical Medicine & Rehabilitation

## 2013-08-05 ENCOUNTER — Telehealth: Payer: Self-pay

## 2013-08-05 DIAGNOSIS — S06369A Traumatic hemorrhage of cerebrum, unspecified, with loss of consciousness of unspecified duration, initial encounter: Secondary | ICD-10-CM

## 2013-08-05 MED ORDER — METHYLPHENIDATE HCL 10 MG PO TABS: 10.0000 mg | ORAL_TABLET | Freq: Three times a day (TID) | ORAL | Status: DC

## 2013-08-05 NOTE — Telephone Encounter (Signed)
Contacted patient to inform her that her RX is ready for pick up.

## 2013-08-05 NOTE — Telephone Encounter (Signed)
Patient is requesting a refill on Ritalin. RX printed for Dr. Riley KillSwartz to sign. Will contact patient when ready for pickup.

## 2013-08-19 ENCOUNTER — Encounter: Payer: 59 | Attending: Physical Medicine & Rehabilitation | Admitting: Physical Medicine & Rehabilitation

## 2013-08-19 ENCOUNTER — Encounter: Payer: Self-pay | Admitting: Physical Medicine & Rehabilitation

## 2013-08-19 VITALS — BP 115/54 | HR 75 | Resp 14 | Ht 65.0 in | Wt 138.0 lb

## 2013-08-19 DIAGNOSIS — X58XXXA Exposure to other specified factors, initial encounter: Secondary | ICD-10-CM | POA: Insufficient documentation

## 2013-08-19 DIAGNOSIS — S065X9A Traumatic subdural hemorrhage with loss of consciousness of unspecified duration, initial encounter: Secondary | ICD-10-CM

## 2013-08-19 DIAGNOSIS — F32A Depression, unspecified: Secondary | ICD-10-CM

## 2013-08-19 DIAGNOSIS — H8110 Benign paroxysmal vertigo, unspecified ear: Secondary | ICD-10-CM | POA: Insufficient documentation

## 2013-08-19 DIAGNOSIS — S065XAA Traumatic subdural hemorrhage with loss of consciousness status unknown, initial encounter: Secondary | ICD-10-CM

## 2013-08-19 DIAGNOSIS — S0636AA Traumatic hemorrhage of cerebrum, unspecified, with loss of consciousness status unknown, initial encounter: Secondary | ICD-10-CM

## 2013-08-19 DIAGNOSIS — S06330A Contusion and laceration of cerebrum, unspecified, without loss of consciousness, initial encounter: Secondary | ICD-10-CM | POA: Insufficient documentation

## 2013-08-19 DIAGNOSIS — F3289 Other specified depressive episodes: Secondary | ICD-10-CM

## 2013-08-19 DIAGNOSIS — F329 Major depressive disorder, single episode, unspecified: Secondary | ICD-10-CM

## 2013-08-19 DIAGNOSIS — IMO0002 Reserved for concepts with insufficient information to code with codable children: Secondary | ICD-10-CM

## 2013-08-19 DIAGNOSIS — G43909 Migraine, unspecified, not intractable, without status migrainosus: Secondary | ICD-10-CM | POA: Insufficient documentation

## 2013-08-19 DIAGNOSIS — S06369A Traumatic hemorrhage of cerebrum, unspecified, with loss of consciousness of unspecified duration, initial encounter: Secondary | ICD-10-CM

## 2013-08-19 DIAGNOSIS — S0633AA Contusion and laceration of cerebrum, unspecified, with loss of consciousness status unknown, initial encounter: Secondary | ICD-10-CM | POA: Insufficient documentation

## 2013-08-19 MED ORDER — TRAMADOL HCL 50 MG PO TABS: 50.0000 mg | ORAL_TABLET | Freq: Four times a day (QID) | ORAL | Status: DC | PRN

## 2013-08-19 MED ORDER — METHYLPHENIDATE HCL 10 MG PO TABS: 10.0000 mg | ORAL_TABLET | Freq: Three times a day (TID) | ORAL | Status: DC

## 2013-08-19 NOTE — Progress Notes (Signed)
Subjective:    Patient ID: Jenna Stafford, female    DOB: 1955-10-21, 58 y.o.   MRN: [id number]  HPI  Tequila is back regarding her TBI. She is working 30-40 hours per week and this seems to be working well. She is using the ritalin once to twice daily, always in the AM. She is performing well in work and managing to balance family obligations as well.   She needs two tooth extractions and has been having some jaw pain due to exposed roots/broken teeth. She has been using ibuprofen 800mg  tid which has helped somewhat in contrlling the pain.    Pain Inventory Average Pain 0 Pain Right Now 0 My pain is n/a  In the last 24 hours, has pain interfered with the following? General activity 0 Relation with others 0 Enjoyment of life 0 What TIME of day is your pain at its worst? n/a Sleep (in general) Fair  Pain is worse with: n/a Pain improves with: n/a Relief from Meds: 8  Mobility walk without assistance ability to climb steps?  yes do you drive?  yes Do you have any goals in this area?  no  Function employed # of hrs/week RN Do you have any goals in this area?  no  Neuro/Psych loss of taste or smell  Prior Studies Any changes since last visit?  no  Physicians involved in your care Any changes since last visit?  no   Family History  Problem Relation Age of Onset  . Heart disease Mother   . COPD Mother   . Other Father   . Other Brother    History   Social History  . Marital Status: Married    Spouse Name: N/A    Number of Children: N/A  . Years of Education: N/A   Social History Main Topics  . Smoking status: Former Smoker -- 0.50 packs/day for 1 years    Types: Cigarettes    Quit date: 08/02/2012  . Smokeless tobacco: Never Used  . Alcohol Use: No  . Drug Use: No  . Sexual Activity: None   Other Topics Concern  . None   Social History Narrative  . None   Past Surgical History  Procedure Laterality Date  . Abdominal hysterectomy    .  Arthroscopic repair acl      right side  . Cesarean section    . Appendectomy    . Knee surgery      left  . Gastric bypass     Past Medical History  Diagnosis Date  . Depression    BP 115/54  Pulse 75  Resp 14  Ht 5\' 5"  (1.651 m)  Wt 138 lb (62.596 kg)  BMI 22.96 kg/m2  SpO2 100%  Opioid Risk Score: 20 Fall Risk Score: Moderate Fall Risk (6-13 points) (patient educated, handout given)   Review of Systems  Constitutional: Positive for appetite change.  Neurological: Positive for headaches.  All other systems reviewed and are negative.       Objective:   Physical Exam  General: Alert and oriented x 3, No apparent distress  HEENT: Head is normocephalic, atraumatic, PERRLA, EOMI, sclera anicteric, oral mucosa pink and moist,   TM's , ext ear canals clear including the left, teeth notable for broken roots at two sites. Neck: Supple without JVD or lymphadenopathy  Heart: Reg rate and rhythm. No murmurs rubs or gallops  Chest: CTA bilaterally without wheezes, rales, or rhonchi; no distress  Abdomen: Soft, non-tender, non-distended, bowel  sounds positive.  Extremities: No clubbing, cyanosis, or edema. Pulses are 2+  Skin: Clean and intact without signs of breakdown  Neuro: Pt is cognitively appropriate with normal insight. STM was functional. Good attention and focus for the most part with simple conversation.. Thoughts were very organized today. Balance with normal gait was normal. . Strength was grossly 5/5 in all 4 limbs with normal sensation and DTR's. CN testing normal  Musculoskeletal: Full ROM, No pain with AROM or PROM in the neck, trunk, or extremities. Posture appropriate.  Psych: Pt's affect is appropriate. Pt is cooperative. Good spirits.    Assessment & Plan:   1. 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  2. BPPV related to the above  3. Hx of migraines with aura, headaches are  also post-traumatic. May have stress and sinus contributions as well.   Plan:  1.Maxalt prn for migraines.  2. Continue topamax at 25mg  qhs and eventually wean to off. If she needs an rx for the 25mg , I will rx.  3. Ibuprofen 800mg  TID prn for pain.  I also gave her another #60 tramadol to help control her jaw pain until she has a dental resolution.  4. Continue ritalin immediate release given her absorption, 10-20mg  Bid #90. If her pcp is willing to fill these, I would not have a problem. 6. . I will see her back in 4 months. I spent 15 min with the patient today

## 2013-08-19 NOTE — Patient Instructions (Signed)
PLEASE CALL ME WITH ANY PROBLEMS OR QUESTIONS (#[phone]).      

## 2013-11-27 ENCOUNTER — Telehealth: Payer: Self-pay

## 2013-11-27 DIAGNOSIS — S06369A Traumatic hemorrhage of cerebrum, unspecified, with loss of consciousness of unspecified duration, initial encounter: Secondary | ICD-10-CM

## 2013-11-27 MED ORDER — METHYLPHENIDATE HCL 10 MG PO TABS: 10.0000 mg | ORAL_TABLET | Freq: Three times a day (TID) | ORAL | Status: DC

## 2013-11-27 NOTE — Telephone Encounter (Signed)
Patient is requesting a refill on Ritalin. RX printed for Dr. Riley KillSwartz to sign will contact patient when ready for pick up. Patient has enough medication to last until Monday.

## 2013-11-28 MED ORDER — METHYLPHENIDATE HCL 10 MG PO TABS: 10.0000 mg | ORAL_TABLET | Freq: Three times a day (TID) | ORAL | Status: DC

## 2013-11-28 NOTE — Telephone Encounter (Signed)
rx reprinted for Jenna LefevreEunice Thomas NP to sign so Ms Val EagleLemons will not have an interruption in her dose schedule due to being out of med. Left message that rx is available for pick up

## 2013-12-16 ENCOUNTER — Encounter: Payer: Self-pay | Admitting: Physical Medicine & Rehabilitation

## 2013-12-16 ENCOUNTER — Encounter: Payer: 59 | Attending: Physical Medicine & Rehabilitation | Admitting: Physical Medicine & Rehabilitation

## 2013-12-16 VITALS — BP 117/60 | HR 78 | Resp 14 | Wt 142.6 lb

## 2013-12-16 DIAGNOSIS — G43909 Migraine, unspecified, not intractable, without status migrainosus: Secondary | ICD-10-CM

## 2013-12-16 DIAGNOSIS — S065X9A Traumatic subdural hemorrhage with loss of consciousness of unspecified duration, initial encounter: Secondary | ICD-10-CM

## 2013-12-16 DIAGNOSIS — S069X9S Unspecified intracranial injury with loss of consciousness of unspecified duration, sequela: Secondary | ICD-10-CM | POA: Insufficient documentation

## 2013-12-16 DIAGNOSIS — G44309 Post-traumatic headache, unspecified, not intractable: Secondary | ICD-10-CM | POA: Insufficient documentation

## 2013-12-16 DIAGNOSIS — Z87891 Personal history of nicotine dependence: Secondary | ICD-10-CM | POA: Insufficient documentation

## 2013-12-16 DIAGNOSIS — H8110 Benign paroxysmal vertigo, unspecified ear: Secondary | ICD-10-CM | POA: Insufficient documentation

## 2013-12-16 DIAGNOSIS — R413 Other amnesia: Secondary | ICD-10-CM | POA: Insufficient documentation

## 2013-12-16 DIAGNOSIS — X58XXXS Exposure to other specified factors, sequela: Secondary | ICD-10-CM | POA: Insufficient documentation

## 2013-12-16 DIAGNOSIS — S0636AA Traumatic hemorrhage of cerebrum, unspecified, with loss of consciousness status unknown, initial encounter: Secondary | ICD-10-CM

## 2013-12-16 DIAGNOSIS — F0781 Postconcussional syndrome: Secondary | ICD-10-CM | POA: Insufficient documentation

## 2013-12-16 DIAGNOSIS — S06369A Traumatic hemorrhage of cerebrum, unspecified, with loss of consciousness of unspecified duration, initial encounter: Secondary | ICD-10-CM

## 2013-12-16 DIAGNOSIS — IMO0002 Reserved for concepts with insufficient information to code with codable children: Secondary | ICD-10-CM

## 2013-12-16 DIAGNOSIS — S065XAA Traumatic subdural hemorrhage with loss of consciousness status unknown, initial encounter: Secondary | ICD-10-CM

## 2013-12-16 DIAGNOSIS — G43109 Migraine with aura, not intractable, without status migrainosus: Secondary | ICD-10-CM | POA: Insufficient documentation

## 2013-12-16 DIAGNOSIS — Z9884 Bariatric surgery status: Secondary | ICD-10-CM | POA: Insufficient documentation

## 2013-12-16 DIAGNOSIS — S069XAS Unspecified intracranial injury with loss of consciousness status unknown, sequela: Secondary | ICD-10-CM | POA: Insufficient documentation

## 2013-12-16 MED ORDER — METHYLPHENIDATE HCL 20 MG PO TABS: 20.0000 mg | ORAL_TABLET | Freq: Two times a day (BID) | ORAL | Status: DC

## 2013-12-16 MED ORDER — TRAMADOL HCL 50 MG PO TABS: 50.0000 mg | ORAL_TABLET | Freq: Four times a day (QID) | ORAL | Status: DC | PRN

## 2013-12-16 NOTE — Patient Instructions (Signed)
MAKE SURE YOU'RE GETTING ENOUGH SLEEP. AT LEAST 8 HOURS A NIGHT

## 2013-12-16 NOTE — Progress Notes (Signed)
Subjective:    Patient ID: Jenna Stafford, female    DOB: 1956-03-13, 58 y.o.   MRN: [id number]  HPI  Jenna Stafford is back regarding her TBI/post-concussion symptoms. She expressed some concerns about being unable to remember some remote events. Day to day conversations are sometimes difficult to remember. She doesn't take her ritalin except for days that she works or has to drive.   She weaned herself off of the topamax to see if this helped with memory and energy levels, but it didn't. Her headaches have been controllable and largely allergy related.  Her teeth were removed oral surgery. She has run out of her tramadol since surgery.     Pain Inventory Average Pain 10 Pain Right Now 0 My pain is constant, dull and aching  In the last 24 hours, has pain interfered with the following? General activity 0 Relation with others 0 Enjoyment of life 0 What TIME of day is your pain at its worst? daytime Sleep (in general) Fair  Pain is worse with: walking, bending and standing Pain improves with: medication Relief from Meds: 10  Mobility walk without assistance ability to climb steps?  yes do you drive?  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  no  Physicians involved in your care Any changes since last visit?  no   Family History  Problem Relation Age of Onset  . Heart disease Mother   . COPD Mother   . Other Father   . Other Brother    History   Social History  . Marital Status: Married    Spouse Name: N/A    Number of Children: N/A  . Years of Education: N/A   Social History Main Topics  . Smoking status: Former Smoker -- 0.50 packs/day for 1 years    Types: Cigarettes    Quit date: 08/02/2012  . Smokeless tobacco: Never Used  . Alcohol Use: No  . Drug Use: No  . Sexual Activity: None   Other Topics Concern  . None   Social  History Narrative  . None   Past Surgical History  Procedure Laterality Date  . Abdominal hysterectomy    . Arthroscopic repair acl      right side  . Cesarean section    . Appendectomy    . Knee surgery      left  . Gastric bypass     Past Medical History  Diagnosis Date  . Depression    BP 117/60  Pulse 78  Resp 14  Wt 142 lb 9.6 oz (64.683 kg)  SpO2 100%  Opioid Risk Score:   Fall Risk Score: Low Fall Risk (0-5 points) (educated and handout for fall prevention in the home given at previous visit)  Review of Systems  Neurological: Positive for dizziness.  All other systems reviewed and are negative.      Objective:   Physical Exam  General: Alert and oriented x 3, No apparent distress  HEENT: Head is normocephalic, atraumatic, PERRLA, EOMI, sclera anicteric, oral mucosa pink and moist, TM's , ext ear canals clear including the left, teeth notable for broken roots at two sites.  Neck: Supple without JVD or lymphadenopathy  Heart: Reg rate and rhythm. No murmurs rubs or gallops  Chest: CTA bilaterally without wheezes, rales, or rhonchi; no distress  Abdomen: Soft, non-tender, non-distended, bowel sounds positive.  Extremities:  No clubbing, cyanosis, or edema. Pulses are 2+  Skin: Clean and intact without signs of breakdown  Neuro: Pt is cognitively appropriate with normal insight. STM was functional. Good attention and focus. Normal insight and awareness.  Thoughts were very organized today. Balance with normal gait was normal. . Strength was grossly 5/5 in all 4 limbs with normal sensation and DTR's. CN testing normal  Musculoskeletal: Full ROM, No pain with AROM or PROM in the neck, trunk, or extremities. Posture appropriate.  Psych: Pt's affect is appropriate. Pt is cooperative. Good spirits.     Assessment & Plan:   1. 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  2. BPPV related to the  above  3. 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 Has stress and sinus contributions as well.  4. Memory loss likely related to activity levels, attention, sleep, etc   Plan:  1.Maxalt prn for migraines.  2. Discussed maximizing sleep. At least 8 hours should be a target. She also needs to have realistic goals with work, family, activity tolerance, etc 3. Refilled tramadol for breakthrough headaches 4. Continue ritalin immediate release given her absorption,  20mg  Bid #60. A second RF was given today. 6. . I will see her back in 3 months. I spent 25 min with the patient today.

## 2014-01-22 ENCOUNTER — Other Ambulatory Visit: Payer: Self-pay | Admitting: Family Medicine

## 2014-01-22 DIAGNOSIS — N63 Unspecified lump in unspecified breast: Secondary | ICD-10-CM

## 2014-02-03 ENCOUNTER — Ambulatory Visit
Admission: RE | Admit: 2014-02-03 | Discharge: 2014-02-03 | Disposition: A | Discharge status: Home / Self Care | Payer: 59 | Source: Ambulatory Visit | Attending: Family Medicine | Admitting: Family Medicine

## 2014-02-03 DIAGNOSIS — N63 Unspecified lump in unspecified breast: Secondary | ICD-10-CM

## 2014-03-12 ENCOUNTER — Other Ambulatory Visit: Payer: Self-pay

## 2014-03-12 MED ORDER — METHYLPHENIDATE HCL 20 MG PO TABS: 20.0000 mg | ORAL_TABLET | Freq: Two times a day (BID) | ORAL | Status: DC

## 2014-03-12 NOTE — Telephone Encounter (Signed)
Patient is requesting a refill on Ritalin. RX printed for Dr. Riley Kill to sign. Patient stated that her husband Adele Barthel) will pick up the RX next week.

## 2014-03-17 ENCOUNTER — Encounter: Payer: 59 | Admitting: Physical Medicine & Rehabilitation

## 2014-03-17 ENCOUNTER — Other Ambulatory Visit: Payer: Self-pay | Admitting: Physical Medicine & Rehabilitation

## 2014-04-01 ENCOUNTER — Encounter: Payer: Self-pay | Admitting: Physical Medicine & Rehabilitation

## 2014-04-01 ENCOUNTER — Telehealth: Payer: Self-pay | Admitting: Physical Medicine & Rehabilitation

## 2014-04-01 DIAGNOSIS — S06369A Traumatic hemorrhage of cerebrum, unspecified, with loss of consciousness of unspecified duration, initial encounter: Secondary | ICD-10-CM

## 2014-04-01 MED ORDER — TRAMADOL HCL 50 MG PO TABS: 50.0000 mg | ORAL_TABLET | Freq: Four times a day (QID) | ORAL | Status: DC | PRN

## 2014-04-01 NOTE — Telephone Encounter (Signed)
Patient was last seen on 6/2. She canceled her 3 mo FU visit. She is requesting a Tramadol refill. Is that okay?

## 2014-04-01 NOTE — Telephone Encounter (Signed)
Attempted to contact patient. Left a voicemail to inform patient that her Tramadol refill was called in at the pharmacy, and to make sure she kept her appt.

## 2014-04-01 NOTE — Telephone Encounter (Signed)
Patient needs a refill on Tramadol called into her pharmacy [facility] in [facility]

## 2014-04-01 NOTE — Telephone Encounter (Signed)
Yes, but she needs to make her next scheduled visit to receive any more

## 2014-04-24 ENCOUNTER — Other Ambulatory Visit: Payer: Self-pay | Admitting: Physical Medicine & Rehabilitation

## 2014-04-30 ENCOUNTER — Other Ambulatory Visit: Payer: Self-pay | Admitting: Physical Medicine & Rehabilitation

## 2014-05-05 ENCOUNTER — Telehealth: Payer: Self-pay | Admitting: *Deleted

## 2014-05-05 DIAGNOSIS — F908 Attention-deficit hyperactivity disorder, other type: Secondary | ICD-10-CM

## 2014-05-05 MED ORDER — METHYLPHENIDATE HCL 20 MG PO TABS: 20.0000 mg | ORAL_TABLET | Freq: Two times a day (BID) | ORAL | Status: DC

## 2014-05-05 NOTE — Telephone Encounter (Signed)
Would like refill for Ritalin.  Husband to pick up.  Printing for ZS to sign in am

## 2014-05-06 NOTE — Telephone Encounter (Signed)
rx signed. thanks

## 2014-05-06 NOTE — Telephone Encounter (Signed)
Called pt and left message that her RX was ready fir pick up

## 2014-05-12 ENCOUNTER — Encounter: Payer: Self-pay | Admitting: Physical Medicine & Rehabilitation

## 2014-05-12 ENCOUNTER — Encounter: Payer: 59 | Attending: Physical Medicine & Rehabilitation | Admitting: Physical Medicine & Rehabilitation

## 2014-05-12 VITALS — BP 128/70 | HR 72 | Resp 14 | Ht 65.0 in | Wt 140.0 lb

## 2014-05-12 DIAGNOSIS — Z87820 Personal history of traumatic brain injury: Secondary | ICD-10-CM | POA: Insufficient documentation

## 2014-05-12 DIAGNOSIS — H8113 Benign paroxysmal vertigo, bilateral: Secondary | ICD-10-CM

## 2014-05-12 DIAGNOSIS — S06369A Traumatic hemorrhage of cerebrum, unspecified, with loss of consciousness of unspecified duration, initial encounter: Secondary | ICD-10-CM

## 2014-05-12 DIAGNOSIS — F900 Attention-deficit hyperactivity disorder, predominantly inattentive type: Secondary | ICD-10-CM

## 2014-05-12 DIAGNOSIS — Z87891 Personal history of nicotine dependence: Secondary | ICD-10-CM | POA: Diagnosis not present

## 2014-05-12 DIAGNOSIS — R413 Other amnesia: Secondary | ICD-10-CM | POA: Insufficient documentation

## 2014-05-12 DIAGNOSIS — F908 Attention-deficit hyperactivity disorder, other type: Secondary | ICD-10-CM

## 2014-05-12 DIAGNOSIS — F329 Major depressive disorder, single episode, unspecified: Secondary | ICD-10-CM | POA: Diagnosis not present

## 2014-05-12 DIAGNOSIS — S065X5S Traumatic subdural hemorrhage with loss of consciousness greater than 24 hours with return to pre-existing conscious level, sequela: Secondary | ICD-10-CM

## 2014-05-12 DIAGNOSIS — G43109 Migraine with aura, not intractable, without status migrainosus: Secondary | ICD-10-CM | POA: Diagnosis not present

## 2014-05-12 DIAGNOSIS — H8110 Benign paroxysmal vertigo, unspecified ear: Secondary | ICD-10-CM | POA: Insufficient documentation

## 2014-05-12 MED ORDER — TRAMADOL HCL 50 MG PO TABS: 50.0000 mg | ORAL_TABLET | Freq: Four times a day (QID) | ORAL | Status: DC | PRN

## 2014-05-12 MED ORDER — METHYLPHENIDATE HCL 20 MG PO TABS: 20.0000 mg | ORAL_TABLET | Freq: Two times a day (BID) | ORAL | Status: DC

## 2014-05-12 MED ORDER — RIZATRIPTAN BENZOATE 10 MG PO TBDP: 10.0000 mg | ORAL_TABLET | ORAL | Status: DC | PRN
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
## 2014-05-13 ENCOUNTER — Other Ambulatory Visit: Payer: Self-pay | Admitting: Physical Medicine & Rehabilitation

## 2014-06-30 ENCOUNTER — Other Ambulatory Visit: Payer: Self-pay | Admitting: Physical Medicine & Rehabilitation

## 2014-08-04 ENCOUNTER — Other Ambulatory Visit: Payer: Self-pay | Admitting: Physical Medicine & Rehabilitation

## 2014-08-04 DIAGNOSIS — F908 Attention-deficit hyperactivity disorder, other type: Secondary | ICD-10-CM

## 2014-08-06 ENCOUNTER — Other Ambulatory Visit: Payer: Self-pay | Admitting: Physical Medicine & Rehabilitation

## 2014-08-06 ENCOUNTER — Other Ambulatory Visit: Payer: Self-pay | Admitting: *Deleted

## 2014-08-06 MED ORDER — RIZATRIPTAN BENZOATE 10 MG PO TBDP: 10.0000 mg | ORAL_TABLET | Freq: Two times a day (BID) | ORAL | Status: DC | PRN

## 2014-08-06 MED ORDER — TRAMADOL HCL 50 MG PO TABS: 50.0000 mg | ORAL_TABLET | Freq: Two times a day (BID) | ORAL | Status: DC | PRN

## 2014-08-10 MED ORDER — METHYLPHENIDATE HCL 20 MG PO TABS: 20.0000 mg | ORAL_TABLET | Freq: Two times a day (BID) | ORAL | Status: DC

## 2014-08-11 ENCOUNTER — Telehealth: Payer: Self-pay | Admitting: *Deleted

## 2014-08-11 NOTE — Telephone Encounter (Signed)
Called patient and left message that her RX was ready for her to pick up

## 2014-09-07 ENCOUNTER — Encounter: Payer: 59 | Attending: Physical Medicine & Rehabilitation | Admitting: Physical Medicine & Rehabilitation

## 2014-09-07 ENCOUNTER — Encounter: Payer: Self-pay | Admitting: Physical Medicine & Rehabilitation

## 2014-09-07 VITALS — BP 122/57 | HR 87 | Resp 14

## 2014-09-07 DIAGNOSIS — G43009 Migraine without aura, not intractable, without status migrainosus: Secondary | ICD-10-CM

## 2014-09-07 DIAGNOSIS — G894 Chronic pain syndrome: Secondary | ICD-10-CM

## 2014-09-07 DIAGNOSIS — H8113 Benign paroxysmal vertigo, bilateral: Secondary | ICD-10-CM

## 2014-09-07 DIAGNOSIS — H8110 Benign paroxysmal vertigo, unspecified ear: Secondary | ICD-10-CM | POA: Insufficient documentation

## 2014-09-07 DIAGNOSIS — Z87820 Personal history of traumatic brain injury: Secondary | ICD-10-CM | POA: Diagnosis not present

## 2014-09-07 DIAGNOSIS — F900 Attention-deficit hyperactivity disorder, predominantly inattentive type: Secondary | ICD-10-CM

## 2014-09-07 DIAGNOSIS — Z79899 Other long term (current) drug therapy: Secondary | ICD-10-CM

## 2014-09-07 DIAGNOSIS — G44309 Post-traumatic headache, unspecified, not intractable: Secondary | ICD-10-CM | POA: Insufficient documentation

## 2014-09-07 DIAGNOSIS — F908 Attention-deficit hyperactivity disorder, other type: Secondary | ICD-10-CM

## 2014-09-07 DIAGNOSIS — Z5181 Encounter for therapeutic drug level monitoring: Secondary | ICD-10-CM

## 2014-09-07 DIAGNOSIS — S065X1S Traumatic subdural hemorrhage with loss of consciousness of 30 minutes or less, sequela: Secondary | ICD-10-CM

## 2014-09-07 MED ORDER — RIZATRIPTAN BENZOATE 10 MG PO TBDP: 10.0000 mg | ORAL_TABLET | Freq: Two times a day (BID) | ORAL | Status: DC | PRN

## 2014-09-07 MED ORDER — METHYLPHENIDATE HCL 20 MG PO TABS: 20.0000 mg | ORAL_TABLET | Freq: Two times a day (BID) | ORAL | Status: DC

## 2014-09-07 MED ORDER — TRAMADOL HCL 50 MG PO TABS: 50.0000 mg | ORAL_TABLET | Freq: Two times a day (BID) | ORAL | Status: DC | PRN

## 2014-09-07 MED ORDER — TOPIRAMATE 25 MG PO TABS: 25.0000 mg | ORAL_TABLET | Freq: Every day | ORAL | Status: DC

## 2014-09-07 NOTE — Patient Instructions (Signed)
YOU NEED TO REDUCE YOUR HOURS AT WORK  REDUCE YOUR STRESS!!!  IF YOU HAVE PROBLEMS WITH THE TOPAMAX YOU CAN TRY DEPAKOTE---JUST LET ME KNOW

## 2014-09-07 NOTE — Progress Notes (Signed)
Subjective:    Patient ID: Jenna Stafford, female    DOB: [birth year], 59 y.o.   MRN: [id number]  HPI  Jenna Stafford is back regarding her TBI and associated h/a and cognitive deficits. She has had some ongoing headaches, which at least are part are allergy driven. She uses tramadol and maxalt for breakthrough pain. We took her off the topamax some time ago.   She has been working a lot of hours with her work---sometimes up to 84 hours per week. She works every other weekend job as well.   Jenna Stafford notices some ongoing memory issues and asks if there is anything different she can do to augment her memory.     Pain Inventory Average Pain 10 Pain Right Now 0 My pain is constant, dull and aching  In the last 24 hours, has pain interfered with the following? General activity 7 Relation with others 7 Enjoyment of life 7 What TIME of day is your pain at its worst? varies Sleep (in general) Good  Pain is worse with: walking, bending, sitting, inactivity and standing Pain improves with: rest and medication Relief from Meds: 10  Mobility ability to climb steps?  yes do you drive?  yes Do you have any goals in this area?  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  no  Neuro/Psych dizziness  Prior Studies Any changes since last visit?  no  Physicians involved in your care Any changes since last visit?  no   Family History  Problem Relation Age of Onset  . Heart disease Mother   . COPD Mother   . Other Father   . Other Brother    History   Social History  . Marital Status: Married    Spouse Name: N/A  . Number of Children: N/A  . Years of Education: N/A   Social History Main Topics  . Smoking status: Former Smoker -- 0.50 packs/day for 1 years    Types: Cigarettes    Quit date: 08/02/2012  . Smokeless tobacco: Never Used  . Alcohol Use: No  . Drug Use: No  . Sexual Activity: Not on file   Other Topics  Concern  . None   Social History Narrative   Past Surgical History  Procedure Laterality Date  . Abdominal hysterectomy    . Arthroscopic repair acl      right side  . Cesarean section    . Appendectomy    . Knee surgery      left  . Gastric bypass     Past Medical History  Diagnosis Date  . Depression    There were no vitals taken for this visit.  Opioid Risk Score:   Fall Risk Score:    Review of Systems  Neurological: Positive for dizziness.  All other systems reviewed and are negative.      Objective:   Physical Exam  General: Alert and oriented x 3, No apparent distress  HEENT: Head is normocephalic, atraumatic, PERRLA, EOMI, sclera anicteric, oral mucosa pink and moist, TM's , ext ear canals clear including the left, teeth notable for broken roots at two sites.  Neck: Supple without JVD or lymphadenopathy  Heart: Reg rate and rhythm. No murmurs rubs or gallops  Chest: CTA bilaterally without wheezes, rales, or rhonchi; no distress  Abdomen: Soft, non-tender, non-distended, bowel sounds positive.  Extremities: No clubbing, cyanosis, or edema. Pulses are 2+  Skin: Clean and  intact without signs of breakdown  Neuro: Pt is cognitively appropriate with normal insight. STM remains unctional. functional attention and focus. Normal insight and awareness. Thoughts were very organized today. Balance with normal gait was normal. Strength was grossly 5/5 in all 4 limbs with normal sensation and DTR's. CN testing normal  Musculoskeletal: Full ROM, No pain with AROM or PROM in the neck, trunk, or extremities. Posture appropriate.  Psych: Pt's affect is appropriate. Pt is cooperative. Good spirits.    Assessment & Plan:   1. 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  2. BPPV related to the above  3. 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 Has stress and sinus contributions as  well.  4. Memory issues likely related to activity levels, attention, sleep, etc. She is also linking it to some dementia her mother is suffering from now as well.   Plan:  1.Maxalt prn for migraines. Will resume topamax at 25mg  qhs---can increase to 50mg  qhs if needed. Consider valproic acid trial also. 2. Discussed maximizing sleep. She also needs to have realistic goals with work, family, activity tolerance, etc. We could consider a medication for memory such as aricept, but I believe her memory problem is more functional.  3. Refilled tramadol for breakthrough headaches, generalized pain.  4. Continue ritalin immediate release given her absorption, 20mg  Bid #60. A second RF was given today.  5. Discussed the potential impact of allergies causing sinus/inner ear congestion which may be further triggering vertigo and headaches. Her work load is also a factor. Suggested scheuled antihistamine and decongestants. May need to see an allergist.  6. . I will see her back in 3 months. I spent 25 minutes today. All questions were encouraged and answered.

## 2014-09-12 ENCOUNTER — Other Ambulatory Visit: Payer: Self-pay | Admitting: Physical Medicine & Rehabilitation

## 2014-09-29 ENCOUNTER — Encounter: Payer: Self-pay | Admitting: Physical Medicine & Rehabilitation

## 2014-10-23 ENCOUNTER — Other Ambulatory Visit: Payer: Self-pay | Admitting: Physical Medicine & Rehabilitation

## 2014-11-04 ENCOUNTER — Other Ambulatory Visit: Payer: Self-pay | Admitting: Physical Medicine & Rehabilitation

## 2014-11-05 ENCOUNTER — Other Ambulatory Visit: Payer: Self-pay | Admitting: *Deleted

## 2014-11-05 MED ORDER — TRAMADOL HCL 50 MG PO TABS: 50.0000 mg | ORAL_TABLET | Freq: Two times a day (BID) | ORAL | Status: DC | PRN

## 2014-11-05 NOTE — Telephone Encounter (Signed)
Recd electronic refill request for Tramadol 50 mg tablets - take one table q12h PRN.  Called into pharmacy

## 2014-11-15 ENCOUNTER — Other Ambulatory Visit: Payer: Self-pay | Admitting: Physical Medicine & Rehabilitation

## 2014-11-15 DIAGNOSIS — F908 Attention-deficit hyperactivity disorder, other type: Secondary | ICD-10-CM

## 2014-11-16 MED ORDER — METHYLPHENIDATE HCL 20 MG PO TABS: 20.0000 mg | ORAL_TABLET | Freq: Two times a day (BID) | ORAL | Status: DC

## 2014-11-16 NOTE — Telephone Encounter (Signed)
Rx was printed for refill on Ritalin for Riley KillSwartz to sign.  She was given 2 rx at 09/07/14 appt but follow up in 3 months which is 12/04/14.

## 2014-11-17 ENCOUNTER — Telehealth: Payer: Self-pay | Admitting: *Deleted

## 2014-11-17 DIAGNOSIS — F908 Attention-deficit hyperactivity disorder, other type: Secondary | ICD-10-CM

## 2014-11-17 MED ORDER — METHYLPHENIDATE HCL 20 MG PO TABS: 20.0000 mg | ORAL_TABLET | Freq: Two times a day (BID) | ORAL | Status: DC

## 2014-11-17 NOTE — Telephone Encounter (Signed)
Mr Jenna Stafford came by to pick up rx but he was told there was not one.  I printed one for Dr Riley KillSwartz on 11/16/14 but it was still on his cart under mail and folder and had not been signed.  I reprinted rx today and had our NP [name] sign it and notified Mr Jenna Stafford he can return to pick it up.

## 2014-12-04 ENCOUNTER — Encounter: Payer: Self-pay | Admitting: Physical Medicine & Rehabilitation

## 2014-12-04 ENCOUNTER — Encounter: Payer: 59 | Attending: Physical Medicine & Rehabilitation | Admitting: Physical Medicine & Rehabilitation

## 2014-12-04 VITALS — BP 133/47 | HR 76 | Resp 14

## 2014-12-04 DIAGNOSIS — F908 Attention-deficit hyperactivity disorder, other type: Secondary | ICD-10-CM

## 2014-12-04 DIAGNOSIS — G43009 Migraine without aura, not intractable, without status migrainosus: Secondary | ICD-10-CM | POA: Insufficient documentation

## 2014-12-04 DIAGNOSIS — F900 Attention-deficit hyperactivity disorder, predominantly inattentive type: Secondary | ICD-10-CM | POA: Diagnosis not present

## 2014-12-04 MED ORDER — METHYLPHENIDATE HCL 20 MG PO TABS: 20.0000 mg | ORAL_TABLET | Freq: Two times a day (BID) | ORAL | Status: DC

## 2014-12-04 MED ORDER — TOPIRAMATE 25 MG PO TABS: 25.0000 mg | ORAL_TABLET | Freq: Every day | ORAL | Status: DC
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
## 2014-12-04 NOTE — Patient Instructions (Signed)
PLEASE CALL ME WITH ANY PROBLEMS OR QUESTIONS (#[phone]).      

## 2014-12-07 ENCOUNTER — Ambulatory Visit: Payer: Medicare Other | Admitting: Physical Medicine & Rehabilitation

## 2015-01-08 ENCOUNTER — Other Ambulatory Visit: Payer: Self-pay | Admitting: Physical Medicine & Rehabilitation

## 2015-02-10 ENCOUNTER — Encounter: Payer: 59 | Attending: Physical Medicine & Rehabilitation | Admitting: Physical Medicine & Rehabilitation

## 2015-02-10 ENCOUNTER — Telehealth: Payer: Self-pay | Admitting: *Deleted

## 2015-02-10 ENCOUNTER — Other Ambulatory Visit: Payer: Self-pay | Admitting: Physical Medicine & Rehabilitation

## 2015-02-10 ENCOUNTER — Encounter: Payer: Self-pay | Admitting: Physical Medicine & Rehabilitation

## 2015-02-10 VITALS — BP 131/70 | HR 88 | Resp 14

## 2015-02-10 DIAGNOSIS — S065X1S Traumatic subdural hemorrhage with loss of consciousness of 30 minutes or less, sequela: Secondary | ICD-10-CM

## 2015-02-10 DIAGNOSIS — G43009 Migraine without aura, not intractable, without status migrainosus: Secondary | ICD-10-CM | POA: Diagnosis not present

## 2015-02-10 DIAGNOSIS — Z5181 Encounter for therapeutic drug level monitoring: Secondary | ICD-10-CM | POA: Diagnosis not present

## 2015-02-10 DIAGNOSIS — S39012A Strain of muscle, fascia and tendon of lower back, initial encounter: Secondary | ICD-10-CM | POA: Insufficient documentation

## 2015-02-10 DIAGNOSIS — Z79899 Other long term (current) drug therapy: Secondary | ICD-10-CM

## 2015-02-10 DIAGNOSIS — F900 Attention-deficit hyperactivity disorder, predominantly inattentive type: Secondary | ICD-10-CM | POA: Diagnosis not present

## 2015-02-10 DIAGNOSIS — F908 Attention-deficit hyperactivity disorder, other type: Secondary | ICD-10-CM

## 2015-02-10 MED ORDER — METHYLPHENIDATE HCL 20 MG PO TABS: 20.0000 mg | ORAL_TABLET | Freq: Two times a day (BID) | ORAL | Status: DC

## 2015-02-10 NOTE — Telephone Encounter (Signed)
Pt called, says she was seen by Dr. Riley Kill this morning. During this visit she told Dr. Riley Kill that she had thrown her back out. She says her back was examined.  She then went on to say that she failed to ask Dr. Riley Kill if she could receive a small script for pain medication to help her in the evenings.

## 2015-02-10 NOTE — Patient Instructions (Signed)
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
## 2015-02-10 NOTE — Telephone Encounter (Signed)
Per Dr Riley Kill: heat, ice, Robaxin (which she already has) and NSAIDS.  I notified Atticus and she said that's fine.

## 2015-02-11 LAB — PMP ALCOHOL METABOLITE (ETG): Ethyl Glucuronide (EtG): NEGATIVE ng/mL

## 2015-02-13 LAB — METHYLPHENIDATE METAB, QUANT, U: Ritalinic Acid: 19484 ng/mL — ABNORMAL HIGH (ref <100)

## 2015-02-18 LAB — FENTANYL (GC/LC/MS), URINE
Fentanyl, confirm: 11.9 ng/mL — AB (ref <0.5)
Norfentanyl, confirm: 28.9 ng/mL — AB (ref <0.5)

## 2015-02-18 LAB — TRAMADOL, URINE
N-DESMETHYL-CIS-TRAMADOL: 2035 ng/mL (ref <100)
Tramadol, Urine: 15336 ng/mL (ref <100)

## 2015-02-19 LAB — PRESCRIPTION MONITORING PROFILE (SOLSTAS)
Amphetamine/Meth: NEGATIVE ng/mL
Barbiturate Screen, Urine: NEGATIVE ng/mL
Benzodiazepine Screen, Urine: NEGATIVE ng/mL
Buprenorphine, Urine: NEGATIVE ng/mL
Cannabinoid Scrn, Ur: NEGATIVE ng/mL
Carisoprodol, Urine: NEGATIVE ng/mL
Cocaine Metabolites: NEGATIVE ng/mL
Creatinine, Urine: 51.03 mg/dL (ref >20.0)
MDMA URINE: NEGATIVE ng/mL
Meperidine, Ur: NEGATIVE ng/mL
Methadone Screen, Urine: NEGATIVE ng/mL
Nitrites, Initial: NEGATIVE ug/mL
Opiate Screen, Urine: NEGATIVE ng/mL
Oxycodone Screen, Ur: NEGATIVE ng/mL
Propoxyphene: NEGATIVE ng/mL
Tapentadol, urine: NEGATIVE ng/mL
Zolpidem, Urine: NEGATIVE ng/mL
pH, Initial: 5.5 pH (ref 4.5–8.9)

## 2015-03-10 ENCOUNTER — Telehealth: Payer: Self-pay | Admitting: *Deleted

## 2015-03-10 MED ORDER — RIZATRIPTAN BENZOATE 10 MG PO TBDP: ORAL_TABLET | ORAL | Status: DC

## 2015-03-10 MED ORDER — TRAMADOL HCL 50 MG PO TABS: ORAL_TABLET | ORAL | Status: DC

## 2015-03-10 NOTE — Telephone Encounter (Signed)
I spoke with Kriste Basque and the only thing she can think of is she changes her mother-in-laws fentanyl patch because she is 95 and may have gotten it on her hands.  The levels are very low.  I cautioned her about handling the patches and washing hands afterwards. She is asking for a refill on her maxalt and tramadol.Refilled with her pharmacy.

## 2015-03-10 NOTE — Telephone Encounter (Signed)
Left message for Shifra to return my call so that she can explain how fentanyl is in her UDS.

## 2015-03-10 NOTE — Progress Notes (Addendum)
Urine drug screen for this encounter is inconsistent. Positive for fentanyl an unprescribed medication.  I have placed a call to [facility] for an explanation as to how this medication is in her urine. See telephone message

## 2015-03-25 ENCOUNTER — Other Ambulatory Visit: Payer: Self-pay

## 2015-03-25 DIAGNOSIS — Z1231 Encounter for screening mammogram for malignant neoplasm of breast: Secondary | ICD-10-CM

## 2015-04-15 ENCOUNTER — Other Ambulatory Visit: Payer: Self-pay

## 2015-04-15 ENCOUNTER — Ambulatory Visit
Admission: RE | Admit: 2015-04-15 | Discharge: 2015-04-15 | Disposition: A | Discharge status: Home / Self Care | Payer: 59 | Source: Ambulatory Visit

## 2015-04-15 ENCOUNTER — Other Ambulatory Visit: Payer: Self-pay | Admitting: Physical Medicine & Rehabilitation

## 2015-04-15 DIAGNOSIS — N6320 Unspecified lump in the left breast, unspecified quadrant: Secondary | ICD-10-CM

## 2015-04-15 DIAGNOSIS — Z1231 Encounter for screening mammogram for malignant neoplasm of breast: Secondary | ICD-10-CM

## 2015-04-15 DIAGNOSIS — G43009 Migraine without aura, not intractable, without status migrainosus: Secondary | ICD-10-CM

## 2015-04-15 MED ORDER — TOPIRAMATE 25 MG PO TABS: 25.0000 mg | ORAL_TABLET | Freq: Every day | ORAL | Status: DC

## 2015-05-05 ENCOUNTER — Encounter: Payer: 59 | Admitting: Physical Medicine & Rehabilitation

## 2015-05-06 ENCOUNTER — Encounter: Payer: 59 | Attending: Physical Medicine & Rehabilitation | Admitting: Registered Nurse

## 2015-05-06 ENCOUNTER — Encounter: Payer: Self-pay | Admitting: Registered Nurse

## 2015-05-06 VITALS — BP 140/73 | HR 84 | Resp 14

## 2015-05-06 DIAGNOSIS — G43109 Migraine with aura, not intractable, without status migrainosus: Secondary | ICD-10-CM | POA: Insufficient documentation

## 2015-05-06 DIAGNOSIS — Z87891 Personal history of nicotine dependence: Secondary | ICD-10-CM | POA: Insufficient documentation

## 2015-05-06 DIAGNOSIS — F900 Attention-deficit hyperactivity disorder, predominantly inattentive type: Secondary | ICD-10-CM

## 2015-05-06 DIAGNOSIS — S062X0S Diffuse traumatic brain injury without loss of consciousness, sequela: Secondary | ICD-10-CM | POA: Insufficient documentation

## 2015-05-06 DIAGNOSIS — H8113 Benign paroxysmal vertigo, bilateral: Secondary | ICD-10-CM

## 2015-05-06 DIAGNOSIS — R42 Dizziness and giddiness: Secondary | ICD-10-CM | POA: Insufficient documentation

## 2015-05-06 DIAGNOSIS — F329 Major depressive disorder, single episode, unspecified: Secondary | ICD-10-CM | POA: Insufficient documentation

## 2015-05-06 DIAGNOSIS — Z5181 Encounter for therapeutic drug level monitoring: Secondary | ICD-10-CM | POA: Diagnosis not present

## 2015-05-06 DIAGNOSIS — X58XXXS Exposure to other specified factors, sequela: Secondary | ICD-10-CM | POA: Insufficient documentation

## 2015-05-06 DIAGNOSIS — Z79899 Other long term (current) drug therapy: Secondary | ICD-10-CM | POA: Diagnosis not present

## 2015-05-06 DIAGNOSIS — F908 Attention-deficit hyperactivity disorder, other type: Secondary | ICD-10-CM

## 2015-05-06 MED ORDER — RIZATRIPTAN BENZOATE 10 MG PO TBDP: ORAL_TABLET | ORAL | Status: DC

## 2015-05-06 MED ORDER — METHYLPHENIDATE HCL 20 MG PO TABS: 20.0000 mg | ORAL_TABLET | Freq: Two times a day (BID) | ORAL | Status: DC

## 2015-05-06 MED ORDER — TRAMADOL HCL 50 MG PO TABS: ORAL_TABLET | ORAL | Status: DC

## 2015-05-06 NOTE — Progress Notes (Signed)
Subjective:    Patient ID: Jenna Stafford, female    DOB: 01/04/1956, 59 y.o.   MRN: [id number]  HPI: [name] is a 59 year old female who returns for follow up appointment and medication refill. She denies any pain at this time, states she has been experiencing frequent migraine's left parietal at times. Her current exercise regime is walking.  Pain Inventory Average Pain 8 Pain Right Now 0 My pain is intermittent  In the last 24 hours, has pain interfered with the following? General activity 8 Relation with others 8 Enjoyment of life 8 What TIME of day is your pain at its worst? morning Sleep (in general) Good  Pain is worse with: walking, bending and standing Pain improves with: rest and medication Relief from Meds: 10  Mobility walk without assistance how many minutes can you walk? Unlimited ability to climb steps?  yes do you drive?  yes Do you have any goals in this area?  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  no  Neuro/Psych dizziness  Prior Studies Any changes since last visit?  no  Physicians involved in your care Any changes since last visit?  no   Family History  Problem Relation Age of Onset  . Heart disease Mother   . COPD Mother   . Other Father   . Other Brother    Social History   Social History  . Marital Status: Married    Spouse Name: N/A  . Number of Children: N/A  . Years of Education: N/A   Social History Main Topics  . Smoking status: Former Smoker -- 0.50 packs/day for 1 years    Types: Cigarettes    Quit date: 08/02/2012  . Smokeless tobacco: Never Used  . Alcohol Use: No  . Drug Use: No  . Sexual Activity: Not Asked   Other Topics Concern  . None   Social History Narrative   Past Surgical History  Procedure Laterality Date  . Abdominal hysterectomy    . Arthroscopic repair acl      right side  . Cesarean section    . Appendectomy    . Knee  surgery      left  . Gastric bypass     Past Medical History  Diagnosis Date  . Depression    BP 140/73 mmHg  Pulse 84  Resp 14  SpO2 100%  Opioid Risk Score:   Fall Risk Score:  `1  Depression screen PHQ 2/9  [facility] 02/10/2015 02/10/2015  Decreased Interest 0 0  Down, Depressed, Hopeless 0 0  PHQ - 2 Score 0 0  Altered sleeping 0 -  Tired, decreased energy 0 -  Change in appetite 0 -  Feeling bad or failure about yourself  0 -  Trouble concentrating 0 -  Moving slowly or fidgety/restless 0 -  Suicidal thoughts 0 -  PHQ-9 Score 0 -     Review of Systems  Neurological: Positive for dizziness.  All other systems reviewed and are negative.      Objective:   Physical Exam  Constitutional: She is oriented to person, place, and time. She appears well-developed and well-nourished.  HENT:  Head: Normocephalic and atraumatic.  Neck: Normal range of motion. Neck supple.  Cardiovascular: Normal rate and regular rhythm.   Pulmonary/Chest: Effort normal and breath sounds normal.  Musculoskeletal:  Normal Muscle Bulk and Muscle Testing Reveals:  Upper Extremities: Full ROM and Muscle Strength 5/5 Lower Extremities: Full ROM and Muscle Strength 5/5 Arises from chair with ease Narrow Based Gait  Neurological: She is alert and oriented to person, place, and time.  Skin: Skin is warm and dry.  Psychiatric: She has a normal mood and affect.  Nursing note and vitals reviewed.         Assessment & Plan:  1. 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  2. BPPV related to the above: No complaints today  3. 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 Memory issues likely related to activity levels, attention, sleep, etc. Continue Ritalin 20 mg one tablet twice a day. Second script given for the following month.   20  minutes of face to face patient care time was spent during this visit. All questions were encouraged and answered.  F/U in 2 months

## 2015-06-30 ENCOUNTER — Encounter: Payer: Self-pay | Admitting: Physical Medicine & Rehabilitation

## 2015-06-30 ENCOUNTER — Encounter: Payer: 59 | Attending: Physical Medicine & Rehabilitation | Admitting: Physical Medicine & Rehabilitation

## 2015-06-30 VITALS — BP 148/81 | HR 73 | Resp 14

## 2015-06-30 DIAGNOSIS — Z87891 Personal history of nicotine dependence: Secondary | ICD-10-CM | POA: Diagnosis not present

## 2015-06-30 DIAGNOSIS — F329 Major depressive disorder, single episode, unspecified: Secondary | ICD-10-CM | POA: Diagnosis not present

## 2015-06-30 DIAGNOSIS — Z5181 Encounter for therapeutic drug level monitoring: Secondary | ICD-10-CM | POA: Insufficient documentation

## 2015-06-30 DIAGNOSIS — G43109 Migraine with aura, not intractable, without status migrainosus: Secondary | ICD-10-CM | POA: Insufficient documentation

## 2015-06-30 DIAGNOSIS — Z79899 Other long term (current) drug therapy: Secondary | ICD-10-CM | POA: Insufficient documentation

## 2015-06-30 DIAGNOSIS — H8113 Benign paroxysmal vertigo, bilateral: Secondary | ICD-10-CM | POA: Insufficient documentation

## 2015-06-30 DIAGNOSIS — S06341S Traumatic hemorrhage of right cerebrum with loss of consciousness of 30 minutes or less, sequela: Secondary | ICD-10-CM

## 2015-06-30 DIAGNOSIS — F908 Attention-deficit hyperactivity disorder, other type: Secondary | ICD-10-CM

## 2015-06-30 DIAGNOSIS — R42 Dizziness and giddiness: Secondary | ICD-10-CM | POA: Diagnosis not present

## 2015-06-30 DIAGNOSIS — S062X0S Diffuse traumatic brain injury without loss of consciousness, sequela: Secondary | ICD-10-CM | POA: Insufficient documentation

## 2015-06-30 DIAGNOSIS — G43009 Migraine without aura, not intractable, without status migrainosus: Secondary | ICD-10-CM

## 2015-06-30 DIAGNOSIS — F900 Attention-deficit hyperactivity disorder, predominantly inattentive type: Secondary | ICD-10-CM

## 2015-06-30 MED ORDER — METHYLPHENIDATE HCL 20 MG PO TABS: 20.0000 mg | ORAL_TABLET | Freq: Two times a day (BID) | ORAL | Status: DC

## 2015-06-30 MED ORDER — RIZATRIPTAN BENZOATE 10 MG PO TBDP: ORAL_TABLET | ORAL | Status: DC

## 2015-06-30 MED ORDER — TRAMADOL HCL 50 MG PO TABS: ORAL_TABLET | ORAL | Status: DC

## 2015-06-30 NOTE — Patient Instructions (Addendum)
PLEASE CALL ME WITH ANY PROBLEMS OR QUESTIONS (#[phone]). HAVE A HAPPY HOLIDAY SEASON!!!   May pick up ritalin scripts in 2 months .

## 2015-06-30 NOTE — Progress Notes (Signed)
Subjective:    Patient ID: Jenna Stafford, female    DOB: 01/25/1956, 59 y.o.   MRN: [id number]  HPI   [name] is here in follow up of her post concussion syndrome. Her knee and back pain have improved. She still has some headaches which have increased a bit recently with the change in weather. She has had some stress related to her job as well. Otherwise her work has been going well. She likes her patient that she works with.   Pain Inventory Average Pain 9 Pain Right Now 4 My pain is constant, dull and stabbing  In the last 24 hours, has pain interfered with the following? General activity 8 Relation with others 8 Enjoyment of life 9 What TIME of day is your pain at its worst? morning, night Sleep (in general) Good  Pain is worse with: walking, bending and standing Pain improves with: rest and medication Relief from Meds: varies  Mobility walk without assistance how many minutes can you walk? 60+ ability to climb steps?  yes do you drive?  yes Do you have any goals in this area?  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  no  Neuro/Psych bladder control problems numbness tingling  Prior Studies Any changes since last visit?  no  Physicians involved in your care Any changes since last visit?  no   Family History  Problem Relation Age of Onset  . Heart disease Mother   . COPD Mother   . Other Father   . Other Brother    Social History   Social History  . Marital Status: Married    Spouse Name: N/A  . Number of Children: N/A  . Years of Education: N/A   Social History Main Topics  . Smoking status: Former Smoker -- 0.50 packs/day for 1 years    Types: Cigarettes    Quit date: 08/02/2012  . Smokeless tobacco: Never Used  . Alcohol Use: No  . Drug Use: No  . Sexual Activity: Not Asked   Other Topics Concern  . None   Social History Narrative   Past Surgical History  Procedure  Laterality Date  . Abdominal hysterectomy    . Arthroscopic repair acl      right side  . Cesarean section    . Appendectomy    . Knee surgery      left  . Gastric bypass     Past Medical History  Diagnosis Date  . Depression    BP 148/81 mmHg  Pulse 73  Resp 14  SpO2 100%  Opioid Risk Score:   Fall Risk Score:  `1  Depression screen PHQ 2/9  [facility] 02/10/2015 02/10/2015  Decreased Interest 0 0  Down, Depressed, Hopeless 0 0  PHQ - 2 Score 0 0  Altered sleeping 0 -  Tired, decreased energy 0 -  Change in appetite 0 -  Feeling bad or failure about yourself  0 -  Trouble concentrating 0 -  Moving slowly or fidgety/restless 0 -  Suicidal thoughts 0 -  PHQ-9 Score 0 -     Review of Systems  Respiratory: Positive for wheezing.   Gastrointestinal: Positive for nausea and abdominal pain.  Genitourinary:       Stress incontinence  Neurological: Positive for numbness.       Tingling  All other systems reviewed and are negative.      Objective:  Physical Exam  General: Alert and oriented x 3, No apparent distress  HEENT: Head is normocephalic, atraumatic, PERRLA, EOMI, sclera anicteric, oral mucosa pink and moist, TM's , ext ear canals clear including the left, teeth notable for broken roots at two sites.  Neck: Supple without JVD or lymphadenopathy  Heart: Reg rate and rhythm. No murmurs rubs or gallops  Chest: CTA bilaterally without wheezes, rales, or rhonchi; no distress  Abdomen: Soft, non-tender, non-distended, bowel sounds positive.  Extremities: No clubbing, cyanosis, or edema. Pulses are 2+  Skin: Clean and intact without signs of breakdown  Neuro: Pt is cognitively appropriate with normal insight. STM is functional. functional attention and focus. Normal insight and awareness. Thoughts were very organized today. Balance with normal gait was normal. Strength was grossly 5/5 in all 4 limbs with normal sensation and DTR's. CN testing normal    Musculoskeletal: right lumbar paraspinals in spasms. Has difficulties with flexion and right lateral bending. Less pain with twisting and extension. SLR negative.  Psych: Pt's affect is appropriate. Pt is cooperative. Good spirits.    Assessment & Plan:   1. 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  2. BPPV related to the above  3. 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 Has stress and sinus contributions as well.  4. Memory issues likely related to activity levels, attention, sleep, etc. She is also linking it to some dementia her mother is suffering from now as well.  5. Low back strain--don't see any signs of anything beyond that at this point    Plan:  1. Maxalt prn for migraines. topamax 25-50mg  qhs for prophylaxis.  2. Reviewed sleep hygiene/consistency which is better. Also discussed stress management techniques.  3. Continue tramadol for breakthrough headaches, generalized pain.  4. Continue ritalin immediate release given her absorption,  Bid #60. Second RF were given today.  6. I will see her back in 4 months. I spent 25 minutes today. All questions were encouraged and answered. She may come pick an rx for ritalin prior to her next appt.

## 2015-07-04 ENCOUNTER — Other Ambulatory Visit: Payer: Self-pay | Admitting: Registered Nurse

## 2015-08-20 ENCOUNTER — Telehealth: Payer: Self-pay

## 2015-08-20 DIAGNOSIS — G43009 Migraine without aura, not intractable, without status migrainosus: Secondary | ICD-10-CM

## 2015-08-20 DIAGNOSIS — F908 Attention-deficit hyperactivity disorder, other type: Secondary | ICD-10-CM

## 2015-08-20 DIAGNOSIS — H8113 Benign paroxysmal vertigo, bilateral: Secondary | ICD-10-CM

## 2015-08-20 DIAGNOSIS — S06341S Traumatic hemorrhage of right cerebrum with loss of consciousness of 30 minutes or less, sequela: Secondary | ICD-10-CM

## 2015-08-20 NOTE — Telephone Encounter (Signed)
Pt is requesting a stronger dose of Tramadol. She is having surgery on 08/27/15 for her kidney stones. She has also passed a stone over the past several days so she has been taking more than prescribed. Please advise. Thanks.

## 2015-08-20 NOTE — Telephone Encounter (Signed)
She may take , 1-2 q8 prn for the short term until her stones have been treated. #90, 0RF

## 2015-08-23 MED ORDER — TRAMADOL HCL 50 MG PO TABS: ORAL_TABLET | ORAL | Status: DC

## 2015-08-23 NOTE — Telephone Encounter (Signed)
Tramadol sent. Pt is aware.

## 2015-09-15 ENCOUNTER — Telehealth: Payer: Self-pay | Admitting: Physical Medicine & Rehabilitation

## 2015-09-15 NOTE — Telephone Encounter (Signed)
Patient requesting a refill on her Ritalin to be mailed to address.  Any questions please call her at [phone].

## 2015-09-16 NOTE — Telephone Encounter (Signed)
She may have her ritalin refilled. We cannot mail it

## 2015-09-16 NOTE — Telephone Encounter (Signed)
Can you print and sign for her Ritalin? I can give her a call that it needs to be picked up. Thanks!

## 2015-09-16 NOTE — Telephone Encounter (Signed)
Pt would like a refill on her Ritalin. Please advise on refill?

## 2015-09-17 ENCOUNTER — Telehealth: Payer: Self-pay

## 2015-09-17 DIAGNOSIS — F908 Attention-deficit hyperactivity disorder, other type: Secondary | ICD-10-CM

## 2015-09-17 MED ORDER — METHYLPHENIDATE HCL 20 MG PO TABS: 20.0000 mg | ORAL_TABLET | Freq: Two times a day (BID) | ORAL | Status: DC

## 2015-09-17 NOTE — Telephone Encounter (Signed)
Script printed. Pt aware to pick up before 2:00 pm today.

## 2015-09-17 NOTE — Telephone Encounter (Signed)
Ritalin refilled

## 2015-09-23 ENCOUNTER — Other Ambulatory Visit: Payer: Self-pay | Admitting: *Deleted

## 2015-09-23 DIAGNOSIS — F908 Attention-deficit hyperactivity disorder, other type: Secondary | ICD-10-CM

## 2015-09-23 DIAGNOSIS — G43009 Migraine without aura, not intractable, without status migrainosus: Secondary | ICD-10-CM

## 2015-09-23 DIAGNOSIS — S06341S Traumatic hemorrhage of right cerebrum with loss of consciousness of 30 minutes or less, sequela: Secondary | ICD-10-CM

## 2015-09-23 DIAGNOSIS — H8113 Benign paroxysmal vertigo, bilateral: Secondary | ICD-10-CM

## 2015-09-23 MED ORDER — TRAMADOL HCL 50 MG PO TABS: ORAL_TABLET | ORAL | Status: DC

## 2015-10-18 ENCOUNTER — Encounter: Payer: 59 | Attending: Physical Medicine & Rehabilitation | Admitting: Physical Medicine & Rehabilitation

## 2015-10-18 ENCOUNTER — Encounter: Payer: Self-pay | Admitting: Physical Medicine & Rehabilitation

## 2015-10-18 VITALS — BP 152/83 | HR 90 | Resp 15

## 2015-10-18 DIAGNOSIS — F329 Major depressive disorder, single episode, unspecified: Secondary | ICD-10-CM | POA: Insufficient documentation

## 2015-10-18 DIAGNOSIS — S066X0A Traumatic subarachnoid hemorrhage without loss of consciousness, initial encounter: Secondary | ICD-10-CM | POA: Insufficient documentation

## 2015-10-18 DIAGNOSIS — Z87891 Personal history of nicotine dependence: Secondary | ICD-10-CM | POA: Insufficient documentation

## 2015-10-18 DIAGNOSIS — R51 Headache: Secondary | ICD-10-CM | POA: Insufficient documentation

## 2015-10-18 DIAGNOSIS — S39012A Strain of muscle, fascia and tendon of lower back, initial encounter: Secondary | ICD-10-CM | POA: Insufficient documentation

## 2015-10-18 DIAGNOSIS — S065X0A Traumatic subdural hemorrhage without loss of consciousness, initial encounter: Secondary | ICD-10-CM | POA: Insufficient documentation

## 2015-10-18 DIAGNOSIS — G43109 Migraine with aura, not intractable, without status migrainosus: Secondary | ICD-10-CM | POA: Insufficient documentation

## 2015-10-18 DIAGNOSIS — X58XXXA Exposure to other specified factors, initial encounter: Secondary | ICD-10-CM | POA: Diagnosis not present

## 2015-10-18 DIAGNOSIS — R42 Dizziness and giddiness: Secondary | ICD-10-CM | POA: Diagnosis not present

## 2015-10-18 DIAGNOSIS — F900 Attention-deficit hyperactivity disorder, predominantly inattentive type: Secondary | ICD-10-CM | POA: Diagnosis not present

## 2015-10-18 DIAGNOSIS — F908 Attention-deficit hyperactivity disorder, other type: Secondary | ICD-10-CM

## 2015-10-18 DIAGNOSIS — S069X0A Unspecified intracranial injury without loss of consciousness, initial encounter: Secondary | ICD-10-CM | POA: Diagnosis not present

## 2015-10-18 MED ORDER — METHYLPHENIDATE HCL 20 MG PO TABS: 20.0000 mg | ORAL_TABLET | Freq: Two times a day (BID) | ORAL | Status: DC

## 2015-10-18 NOTE — Progress Notes (Signed)
Subjective:    Patient ID: Jenna Stafford, female    DOB: 07/18/1955, 60 y.o.   MRN: [id number]  HPI   Jenna JoinerRebecca is here in follow up of her TBI. Her ureteral stenting was successful--she has had a little urinary incontinence post procedure.  She has noted that her blood pressure has been elevated over the last few months. She doesn't really know why. Her meds haven't changed. She remains active and working.   Her ritalin remains effective. Her headaches have been tolerable. She stopped the topamax due to risk of stones.     Pain Inventory Average Pain 8 Pain Right Now 0 My pain is constant, dull and aching  In the last 24 hours, has pain interfered with the following? General activity 8 Relation with others 7 Enjoyment of life 9 What TIME of day is your pain at its worst? morning, night Sleep (in general) Good  Pain is worse with: walking, bending and standing Pain improves with: rest and medication Relief from Meds: 10  Mobility walk without assistance how many minutes can you walk? unlimited  ability to climb steps?  yes do you drive?  yes Do you have any goals in this area?  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  no  Neuro/Psych dizziness depression  Prior Studies Renal US  Physicians involved in your care Any changes since last visit?  no   Family History  Problem Relation Age of Onset  . Heart disease Mother   . COPD Mother   . Other Father   . Other Brother    Social History   Social History  . Marital Status: Married    Spouse Name: N/A  . Number of Children: N/A  . Years of Education: N/A   Social History Main Topics  . Smoking status: Former Smoker -- 0.50 packs/day for 1 years    Types: Cigarettes    Quit date: 08/02/2012  . Smokeless tobacco: Never Used  . Alcohol Use: No  . Drug Use: No  . Sexual Activity: Not Asked   Other Topics Concern  . None   Social  History Narrative   Past Surgical History  Procedure Laterality Date  . Abdominal hysterectomy    . Arthroscopic repair acl      right side  . Cesarean section    . Appendectomy    . Knee surgery      left  . Gastric bypass     Past Medical History  Diagnosis Date  . Depression    BP 152/83 mmHg  Pulse 90  Resp 15  SpO2 99%  Opioid Risk Score:   Fall Risk Score:  `1  Depression screen PHQ 2/9  [facility] 02/10/2015 02/10/2015  Decreased Interest 0 0  Down, Depressed, Hopeless 0 0  PHQ - 2 Score 0 0  Altered sleeping 0 -  Tired, decreased energy 0 -  Change in appetite 0 -  Feeling bad or failure about yourself  0 -  Trouble concentrating 0 -  Moving slowly or fidgety/restless 0 -  Suicidal thoughts 0 -  PHQ-9 Score 0 -     Review of Systems  Respiratory: Positive for wheezing.   Neurological: Positive for dizziness.  Hematological: Bruises/bleeds easily.  Psychiatric/Behavioral: Positive for dysphoric mood.  All other systems reviewed and are negative.      Objective:   Physical Exam  General: Alert  and oriented x 3, No apparent distress  HEENT: Head is normocephalic, atraumatic, PERRLA, EOMI, sclera anicteric, oral mucosa pink and moist, TM's , ext ear canals clear including the left, teeth notable for broken roots at two sites.  Neck: Supple without JVD or lymphadenopathy  Heart: Reg rate and rhythm. No murmurs rubs or gallops  Chest: CTA bilaterally without wheezes, rales, or rhonchi; no distress  Abdomen: Soft, non-tender, non-distended, bowel sounds positive.  Extremities: No clubbing, cyanosis, or edema. Pulses are 2+  Skin: Clean and intact without signs of breakdown  Neuro: Pt is cognitively appropriate with normal insight. STM is functional. functional attention and focus. Normal insight and awareness. Thoughts were very organized today. Balance with normal gait was normal. Strength was grossly 5/5 in all 4 limbs with normal sensation  and DTR's. CN testing normal  Musculoskeletal: right lumbar paraspinals in spasms. Has difficulties with flexion and right lateral bending. Less pain with twisting and extension. SLR negative.  Psych: Pt's affect is appropriate. Pt is cooperative. Good spirits.    Assessment & Plan:   1. 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  2. BPPV related to the above  3. 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 Has stress and sinus contributions as well.  4. Memory issues likely related to activity levels, attention, sleep, etc. She is also linking it to some dementia her mother is suffering from now as well.  5. Low back strain--don't see any signs of anything beyond that at this point   Plan:  1. Maxalt prn for migraines.  Off topamax due to kidney stones 2. Reviewed sleep hygiene/consistency which is better. Also discussed stress management techniques.  3. Continue tramadol for breakthrough headaches, generalized pain.  4. Continue ritalin immediate release given her absorption,  Bid #60. Second RF were given today.  6. I will see her back in 4 months. I spent 15 minutes today. All questions were encouraged and answered. She may come pick an rx for ritalin prior to her next appt

## 2015-10-18 NOTE — Patient Instructions (Signed)
  PLEASE CALL ME WITH ANY PROBLEMS OR QUESTIONS (#[phone]).      

## 2015-10-27 ENCOUNTER — Ambulatory Visit: Payer: 59 | Admitting: Physical Medicine & Rehabilitation

## 2015-12-29 ENCOUNTER — Telehealth: Payer: Self-pay | Admitting: Physical Medicine & Rehabilitation

## 2015-12-29 DIAGNOSIS — G43009 Migraine without aura, not intractable, without status migrainosus: Secondary | ICD-10-CM

## 2015-12-29 DIAGNOSIS — H8113 Benign paroxysmal vertigo, bilateral: Secondary | ICD-10-CM

## 2015-12-29 DIAGNOSIS — F908 Attention-deficit hyperactivity disorder, other type: Secondary | ICD-10-CM

## 2015-12-29 DIAGNOSIS — S06341S Traumatic hemorrhage of right cerebrum with loss of consciousness of 30 minutes or less, sequela: Secondary | ICD-10-CM

## 2015-12-29 MED ORDER — METHYLPHENIDATE HCL 20 MG PO TABS: 20.0000 mg | ORAL_TABLET | Freq: Two times a day (BID) | ORAL | Status: DC

## 2015-12-29 MED ORDER — TRAMADOL HCL 50 MG PO TABS: ORAL_TABLET | ORAL | Status: DC

## 2015-12-29 NOTE — Telephone Encounter (Signed)
ok 

## 2015-12-29 NOTE — Telephone Encounter (Signed)
If approved. Please print and sign and we contact patient. Please and thank you.

## 2015-12-29 NOTE — Telephone Encounter (Signed)
Patient needs a refill on Ritalin.  Please call patient when ready.

## 2015-12-29 NOTE — Telephone Encounter (Signed)
Tramadol too.

## 2015-12-29 NOTE — Telephone Encounter (Signed)
Please call patient and let her know both RXs are ready to be picked up. Please and thank you.

## 2015-12-29 NOTE — Telephone Encounter (Signed)
Patient is also needing a refill on Tramadol.

## 2015-12-30 ENCOUNTER — Telehealth: Payer: Self-pay

## 2015-12-30 NOTE — Telephone Encounter (Signed)
Pt called stating that her Ritalin rx has a do not fill date of 01/24/16, however she states that she is out of medication. Please advise.

## 2015-12-31 NOTE — Telephone Encounter (Signed)
Can someone write a new rx? (or can we call the pharmacy just to activate the current rx?).  thx

## 2016-01-03 NOTE — Telephone Encounter (Signed)
Pt got it filled at pharmacy on 12/30/15.

## 2016-02-14 ENCOUNTER — Encounter: Payer: Self-pay | Admitting: Physical Medicine & Rehabilitation

## 2016-02-14 ENCOUNTER — Encounter: Payer: 59 | Attending: Physical Medicine & Rehabilitation | Admitting: Physical Medicine & Rehabilitation

## 2016-02-14 ENCOUNTER — Ambulatory Visit (HOSPITAL_COMMUNITY)
Admission: RE | Admit: 2016-02-14 | Discharge: 2016-02-14 | Disposition: A | Discharge status: Home / Self Care | Payer: 59 | Source: Ambulatory Visit | Attending: Physical Medicine & Rehabilitation | Admitting: Physical Medicine & Rehabilitation

## 2016-02-14 VITALS — BP 181/78 | HR 84

## 2016-02-14 DIAGNOSIS — M5010 Cervical disc disorder with radiculopathy, unspecified cervical region: Secondary | ICD-10-CM | POA: Insufficient documentation

## 2016-02-14 DIAGNOSIS — G43001 Migraine without aura, not intractable, with status migrainosus: Secondary | ICD-10-CM

## 2016-02-14 DIAGNOSIS — F329 Major depressive disorder, single episode, unspecified: Secondary | ICD-10-CM | POA: Diagnosis not present

## 2016-02-14 DIAGNOSIS — Z87820 Personal history of traumatic brain injury: Secondary | ICD-10-CM | POA: Insufficient documentation

## 2016-02-14 DIAGNOSIS — R200 Anesthesia of skin: Secondary | ICD-10-CM | POA: Diagnosis not present

## 2016-02-14 DIAGNOSIS — G43909 Migraine, unspecified, not intractable, without status migrainosus: Secondary | ICD-10-CM | POA: Insufficient documentation

## 2016-02-14 DIAGNOSIS — G43009 Migraine without aura, not intractable, without status migrainosus: Secondary | ICD-10-CM

## 2016-02-14 DIAGNOSIS — M25512 Pain in left shoulder: Secondary | ICD-10-CM | POA: Insufficient documentation

## 2016-02-14 DIAGNOSIS — S065X0S Traumatic subdural hemorrhage without loss of consciousness, sequela: Secondary | ICD-10-CM

## 2016-02-14 DIAGNOSIS — F908 Attention-deficit hyperactivity disorder, other type: Secondary | ICD-10-CM

## 2016-02-14 DIAGNOSIS — H8113 Benign paroxysmal vertigo, bilateral: Secondary | ICD-10-CM

## 2016-02-14 DIAGNOSIS — F900 Attention-deficit hyperactivity disorder, predominantly inattentive type: Secondary | ICD-10-CM | POA: Diagnosis not present

## 2016-02-14 DIAGNOSIS — M25511 Pain in right shoulder: Secondary | ICD-10-CM | POA: Insufficient documentation

## 2016-02-14 DIAGNOSIS — G8929 Other chronic pain: Secondary | ICD-10-CM | POA: Diagnosis present

## 2016-02-14 DIAGNOSIS — S06341S Traumatic hemorrhage of right cerebrum with loss of consciousness of 30 minutes or less, sequela: Secondary | ICD-10-CM

## 2016-02-14 MED ORDER — RIZATRIPTAN BENZOATE 10 MG PO TBDP: ORAL_TABLET | ORAL | 5 refills | Status: DC

## 2016-02-14 MED ORDER — METHYLPHENIDATE HCL 20 MG PO TABS: 20.0000 mg | ORAL_TABLET | Freq: Two times a day (BID) | ORAL | 0 refills | Status: DC

## 2016-02-14 NOTE — Patient Instructions (Signed)
PLEASE CALL ME WITH ANY PROBLEMS OR QUESTIONS ([phone])  

## 2016-02-14 NOTE — Progress Notes (Signed)
Subjective:    Patient ID: Jenna Stafford, female    DOB: 1955/12/27, 60 y.o.   MRN: [id number]  HPI  Kimeka is here in follow up of her chronic pain and post-TBI issues. She reports 1-2 month history of increasing shoulder and arm pain with associated numbness of both whole-hands. Its worst at night and when she is using her hands for self-care and every day activities.  No definite neck pain although her traps are tender. She had symptoms remotely 20-30 years ago when a CT of her neck was done---she recalls some bulging disks in the upper cervical spine   Headaches and cognition have been stable. Her back pain has improved.  she's trying to be better about her mechanics and being more realistic in generally   Pain Inventory Average Pain 7 Pain Right Now 0 My pain is constant, dull and aching  In the last 24 hours, has pain interfered with the following? General activity 4 Relation with others 0 Enjoyment of life 4 What TIME of day is your pain at its worst? morning, night Sleep (in general) Good  Pain is worse with: walking, bending and standing Pain improves with: rest and medication Relief from Meds: 10  Mobility walk without assistance how many minutes can you walk? unlimited ability to climb steps?  yes do you drive?  yes Do you have any goals in this area?  no  Function employed # of hrs/week 36-48 Do you have any goals in this area?  no  Neuro/Psych numbness tingling  Prior Studies Any changes since last visit?  no  Physicians involved in your care Any changes since last visit?  no   Family History  Problem Relation Age of Onset  . Heart disease Mother   . COPD Mother   . Other Father   . Other Brother    Social History   Social History  . Marital status: Married    Spouse name: N/A  . Number of children: N/A  . Years of education: N/A   Social History Main Topics  . Smoking status: Former Smoker    Packs/day: 0.50    Years: 1.00    Types:  Cigarettes    Quit date: 08/02/2012  . Smokeless tobacco: Never Used  . Alcohol use No  . Drug use: No  . Sexual activity: Not Asked   Other Topics Concern  . None   Social History Narrative  . None   Past Surgical History:  Procedure Laterality Date  . ABDOMINAL HYSTERECTOMY    . APPENDECTOMY    . ARTHROSCOPIC REPAIR ACL     right side  . CESAREAN SECTION    . GASTRIC BYPASS    . KNEE SURGERY     left   Past Medical History:  Diagnosis Date  . Depression    BP (!) 181/78   Pulse 84   SpO2 97%   Opioid Risk Score:   Fall Risk Score:  `1  Depression screen PHQ 2/9  Depression screen [facility] 2/9 02/10/2015 02/10/2015  Decreased Interest 0 0  Down, Depressed, Hopeless 0 0  PHQ - 2 Score 0 0  Altered sleeping 0 -  Tired, decreased energy 0 -  Change in appetite 0 -  Feeling bad or failure about yourself  0 -  Trouble concentrating 0 -  Moving slowly or fidgety/restless 0 -  Suicidal thoughts 0 -  PHQ-9 Score 0 -    Review of Systems  Constitutional: Negative.   HENT:  Negative.   Eyes: Negative.   Respiratory: Negative.   Cardiovascular: Negative.   Gastrointestinal: Negative.   Endocrine: Negative.   Genitourinary: Negative.   Musculoskeletal: Positive for neck pain.  Allergic/Immunologic: Negative.   Neurological: Positive for numbness.       Tingling   Hematological: Bruises/bleeds easily.  Psychiatric/Behavioral: Negative.   All other systems reviewed and are negative.      Objective:   Physical Exam  General: Alert and oriented x 3, No apparent distress  HEENT: Head is normocephalic, atraumatic, PERRLA, EOMI, sclera anicteric, oral mucosa pink and moist, TM's , ext ear canals clear including the left, teeth notable for broken roots at two sites.  Neck: Supple without JVD or lymphadenopathy  Heart: Reg rate and rhythm. No murmurs rubs or gallops  Chest: CTA bilaterally without wheezes, rales, or rhonchi; no distress  Abdomen: Soft, non-tender,  non-distended, bowel sounds positive.  Extremities: No clubbing, cyanosis, or edema. Pulses are 2+  Skin: Clean and intact without signs of breakdown  Neuro: Pt is cognitively appropriate with normal insight. STM is functional. functional attention and focus. Normal insight and awareness.  Balance with normal gait was normal. Strength was grossly 5/5 in all 4 limbs with normal sensation and DTR's. Spurlings + on left and equivocal on right.  No motor or sensory findings.  Musculoskeletal: right lumbar paraspinals in spasms. Has difficulties with flexion and right lateral bending. Less pain with twisting and extension. SLR negative.  Psych: Pt's affect is appropriate. Pt is cooperative. Good spirits.    Assessment & Plan:   1. 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  2. BPPV related to the above  3. 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 Has stress and sinus contributions as well.  4. Memory issues likely related to activity levels, attention, sleep, etc. She is also linking it to some dementia her mother is suffering from now as well.  5. Low back strain--resolved 6. Bilateral arm pain and numbness. ?low cervical radiculopathy   Plan:  1. Maxalt prn for migraines.  Off topamax due to kidney stones 2. Recent lower cervical pain/radicular symptoms--intermittent--  -check xrays  -therapy trial if reasonable  - no meds yet  3. Continue tramadol for breakthrough headaches, generalized pain.  4. Continue ritalin immediate release given her absorption,  Bid #60. Second RF were given today.  6. I will see her back in 4 months. I spent 15 minutes today. All questions were encouraged and answered. She may come pick an rx for ritalin prior to her next appt

## 2016-02-16 ENCOUNTER — Telehealth: Payer: Self-pay | Admitting: Physical Medicine & Rehabilitation

## 2016-02-16 DIAGNOSIS — M5412 Radiculopathy, cervical region: Secondary | ICD-10-CM

## 2016-02-16 DIAGNOSIS — M5030 Other cervical disc degeneration, unspecified cervical region: Secondary | ICD-10-CM

## 2016-02-16 DIAGNOSIS — M542 Cervicalgia: Secondary | ICD-10-CM

## 2016-02-16 NOTE — Telephone Encounter (Signed)
Pt returned call. She states that she would like it done at [facility]. She would like to try to get it tomorrow because she is going out of town next week. I advised pt that it needs to be approved by insurance first and then depends on availability at the imaging office. Pt states that if she cant do it tomorrow, she could do it on August 14th or 16th.

## 2016-02-16 NOTE — Telephone Encounter (Signed)
I have reviewed xrays.   There is moderate disc space narrowing at C4-5, C5-6, and C6-7. There is no spondylolisthesis.   I called her and left message. Need to know where she would like MRI done. I asked her to call back

## 2016-02-16 NOTE — Telephone Encounter (Signed)
I ordered MRI

## 2016-02-28 ENCOUNTER — Ambulatory Visit
Admission: RE | Admit: 2016-02-28 | Discharge: 2016-02-28 | Disposition: A | Discharge status: Home / Self Care | Payer: 59 | Source: Ambulatory Visit | Attending: Physical Medicine & Rehabilitation | Admitting: Physical Medicine & Rehabilitation

## 2016-02-28 ENCOUNTER — Telehealth: Payer: Self-pay | Admitting: Physical Medicine & Rehabilitation

## 2016-02-28 DIAGNOSIS — H8113 Benign paroxysmal vertigo, bilateral: Secondary | ICD-10-CM

## 2016-02-28 DIAGNOSIS — M5412 Radiculopathy, cervical region: Secondary | ICD-10-CM

## 2016-02-28 DIAGNOSIS — F908 Attention-deficit hyperactivity disorder, other type: Secondary | ICD-10-CM

## 2016-02-28 DIAGNOSIS — M542 Cervicalgia: Secondary | ICD-10-CM

## 2016-02-28 DIAGNOSIS — S06341S Traumatic hemorrhage of right cerebrum with loss of consciousness of 30 minutes or less, sequela: Secondary | ICD-10-CM

## 2016-02-28 DIAGNOSIS — M5030 Other cervical disc degeneration, unspecified cervical region: Secondary | ICD-10-CM

## 2016-02-28 DIAGNOSIS — G43009 Migraine without aura, not intractable, without status migrainosus: Secondary | ICD-10-CM

## 2016-02-28 MED ORDER — TRAMADOL HCL 50 MG PO TABS: ORAL_TABLET | ORAL | 1 refills | Status: DC

## 2016-02-28 NOTE — Telephone Encounter (Signed)
I called patient today. CERVICAL MRI RESULTS:  C4-5: Broad-based posterior disc osteophyte complex results in mild bilateral neural foraminal stenosis and flattening of the ventral thecal sac without significant spinal stenosis.  C5-6: Broad-based posterior disc osteophyte complex results in mild bilateral neural foraminal stenosis. There is a more focal right central disc protrusion without significant spinal stenosis.  C6-7: Left paracentral disc protrusion without significant spinal stenosis. Uncovertebral hypertrophy results in mild left greater than right neural foraminal stenosis.   Have ordered outpt therapy at [facility] Can someone call in ultram 50mg  1-2 q6 prn #120, 1 RF? thanks

## 2016-02-28 NOTE — Telephone Encounter (Signed)
Tramadol called to pharmacy. 

## 2016-04-24 ENCOUNTER — Telehealth: Payer: Self-pay | Admitting: Physical Medicine & Rehabilitation

## 2016-04-24 DIAGNOSIS — F908 Attention-deficit hyperactivity disorder, other type: Secondary | ICD-10-CM

## 2016-04-24 NOTE — Telephone Encounter (Signed)
Patient needs a refill on Methylphenidate.  She also as not been able to follow up with therapy due to her mothers decline in health.  Please call patient when prescription is ready.

## 2016-04-26 MED ORDER — METHYLPHENIDATE HCL 20 MG PO TABS: 20.0000 mg | ORAL_TABLET | Freq: Two times a day (BID) | ORAL | 0 refills | Status: DC

## 2016-04-26 NOTE — Telephone Encounter (Signed)
Called pt, left VM for pt to come by and pick up scripts

## 2016-04-26 NOTE — Telephone Encounter (Signed)
rxs written.

## 2016-06-12 ENCOUNTER — Encounter: Payer: 59 | Admitting: Physical Medicine & Rehabilitation

## 2016-06-20 ENCOUNTER — Telehealth: Payer: Self-pay | Admitting: *Deleted

## 2016-06-20 NOTE — Telephone Encounter (Signed)
Jenna Stafford is  Asking for refill on her tramadol and ritalin.  She was offered appt tomorrow and says she cannot make that appt. Jenna Stafford has not been seen since July.  No Rx will be issued without a visit.  I spoke with Jenna Stafford and let her know this information.  She is making arrangements to be seen.

## 2016-06-21 ENCOUNTER — Encounter: Payer: 59 | Attending: Physical Medicine & Rehabilitation | Admitting: Physical Medicine & Rehabilitation

## 2016-06-21 ENCOUNTER — Encounter: Payer: Self-pay | Admitting: Physical Medicine & Rehabilitation

## 2016-06-21 ENCOUNTER — Ambulatory Visit: Payer: 59 | Admitting: Physical Medicine & Rehabilitation

## 2016-06-21 VITALS — BP 154/81 | HR 79 | Resp 14

## 2016-06-21 DIAGNOSIS — R200 Anesthesia of skin: Secondary | ICD-10-CM | POA: Insufficient documentation

## 2016-06-21 DIAGNOSIS — R52 Pain, unspecified: Secondary | ICD-10-CM | POA: Insufficient documentation

## 2016-06-21 DIAGNOSIS — G43109 Migraine with aura, not intractable, without status migrainosus: Secondary | ICD-10-CM | POA: Diagnosis not present

## 2016-06-21 DIAGNOSIS — H8110 Benign paroxysmal vertigo, unspecified ear: Secondary | ICD-10-CM | POA: Diagnosis not present

## 2016-06-21 DIAGNOSIS — H8113 Benign paroxysmal vertigo, bilateral: Secondary | ICD-10-CM

## 2016-06-21 DIAGNOSIS — M79601 Pain in right arm: Secondary | ICD-10-CM | POA: Insufficient documentation

## 2016-06-21 DIAGNOSIS — S065X1S Traumatic subdural hemorrhage with loss of consciousness of 30 minutes or less, sequela: Secondary | ICD-10-CM | POA: Diagnosis not present

## 2016-06-21 DIAGNOSIS — Z87820 Personal history of traumatic brain injury: Secondary | ICD-10-CM | POA: Insufficient documentation

## 2016-06-21 DIAGNOSIS — M79602 Pain in left arm: Secondary | ICD-10-CM | POA: Insufficient documentation

## 2016-06-21 DIAGNOSIS — Z79899 Other long term (current) drug therapy: Secondary | ICD-10-CM

## 2016-06-21 DIAGNOSIS — M5412 Radiculopathy, cervical region: Secondary | ICD-10-CM

## 2016-06-21 DIAGNOSIS — M5030 Other cervical disc degeneration, unspecified cervical region: Secondary | ICD-10-CM | POA: Diagnosis not present

## 2016-06-21 DIAGNOSIS — G43009 Migraine without aura, not intractable, without status migrainosus: Secondary | ICD-10-CM

## 2016-06-21 DIAGNOSIS — Z09 Encounter for follow-up examination after completed treatment for conditions other than malignant neoplasm: Secondary | ICD-10-CM | POA: Insufficient documentation

## 2016-06-21 DIAGNOSIS — Z5181 Encounter for therapeutic drug level monitoring: Secondary | ICD-10-CM

## 2016-06-21 DIAGNOSIS — F908 Attention-deficit hyperactivity disorder, other type: Secondary | ICD-10-CM

## 2016-06-21 MED ORDER — TRAMADOL HCL 50 MG PO TABS: ORAL_TABLET | ORAL | 1 refills | Status: DC

## 2016-06-21 MED ORDER — METHYLPHENIDATE HCL 20 MG PO TABS: 20.0000 mg | ORAL_TABLET | ORAL | 0 refills | Status: DC

## 2016-06-21 NOTE — Addendum Note (Signed)
Addended by: Angela NevinWESSLING, Jenna Stafford on: 06/21/2016 12:54 PM   Modules accepted: Orders

## 2016-06-21 NOTE — Patient Instructions (Signed)
PLEASE CALL ME WITH ANY PROBLEMS OR QUESTIONS ([phone])   HAPPY HOLIDAYS!!!!                    *                * *             *   *   *         *  *   *  *  *     *  *  *  *  *  *  * *  *  *  *  *  *  *  *  *  * *               *  *               *  *               *  *  

## 2016-06-21 NOTE — Progress Notes (Signed)
Subjective:    Patient ID: Jenna Stafford, female    DOB: 07/18/1955, 60 y.o.   MRN: [id number]  HPI   Jenna JoinerRebecca is here in follow up of her TBI and associated deficits. She has had some vertigo after staining her deck.   I had contacted Jenna JoinerRebecca regarding her MRI in august. Because of the declining health and the subsequent passing of her mother, she never made it to therapy. However, she has been able to back off work lately and some of the symptoms resolved on their own. (Her patient passed away). 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 She asked if if we could go up on her ritalin to help with her concentration.   Her pain levels have been improved. When she has a migraine, the maxalt works well. She is off topamax     Pain Inventory Average Pain 9 Pain Right Now 0 My pain is intermittent, dull and aching  In the last 24 hours, has pain interfered with the following? General activity 9 Relation with others 9 Enjoyment of life 9 What TIME of day is your pain at its worst? morning, night Sleep (in general) Good  Pain is worse with: walking, bending and standing Pain improves with: rest, therapy/exercise and medication Relief from Meds: 10  Mobility walk without assistance how many minutes can you walk? unlimited ability to climb steps?  yes do you drive?  yes Do you have any goals in this area?  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  no  Physicians involved in your care Any changes since last visit?  no   Family History  Problem Relation Age of Onset  . Heart disease Mother   . COPD Mother   . Other Father   . Other Brother    Social History   Social History  . Marital status: Married    Spouse name: N/A  . Number of children: N/A  . Years of education: N/A   Social History Main Topics  . Smoking status: Former Smoker   Packs/day: 0.50    Years: 1.00    Types: Cigarettes    Quit date: 08/02/2012  . Smokeless tobacco: Never Used  . Alcohol use No  . Drug use: No  . Sexual activity: Not Asked   Other Topics Concern  . None   Social History Narrative  . None   Past Surgical History:  Procedure Laterality Date  . ABDOMINAL HYSTERECTOMY    . APPENDECTOMY    . ARTHROSCOPIC REPAIR ACL     right side  . CESAREAN SECTION    . GASTRIC BYPASS    . KNEE SURGERY     left   Past Medical History:  Diagnosis Date  . Depression    BP (!) 154/81 (BP Location: Left Arm, Patient Position: Sitting, Cuff Size: Large)   Pulse 79   Resp 14   SpO2 97%   Opioid Risk Score:   Fall Risk Score:  `1  Depression screen PHQ 2/9  [facility] 02/10/2015 02/10/2015  Decreased Interest 0 0  Down, Depressed, Hopeless 0 0  PHQ - 2 Score 0 0  Altered sleeping 0 -  Tired, decreased energy 0 -  Change in appetite 0 -  Feeling bad or failure about yourself  0 -  Trouble concentrating 0 -  Moving slowly or fidgety/restless 0 -  Suicidal thoughts 0 -  PHQ-9 Score 0 -    Review of Systems  Constitutional: Negative.   HENT: Negative.   Eyes: Negative.   Respiratory: Negative.   Cardiovascular: Negative.   Gastrointestinal: Negative.   Endocrine: Negative.   Genitourinary:       Incontinence  Musculoskeletal: Negative.   Skin: Negative.   Allergic/Immunologic: Negative.   Neurological: Positive for headaches.  Hematological: Negative.   Psychiatric/Behavioral: Negative.   All other systems reviewed and are negative.      Objective:   Physical Exam  General: no distress HEENT:PERRL  Neck:Supple without JVD or lymphadenopathy  Heart:RRR  Chest:cta b  Abdomen:Soft, non-tender, non-distended, bowel sounds positive.  Extremities:No clubbing, cyanosis, or edema. Pulses are 2+  Skin:Clean and intact without signs of breakdown  Neuro:normal motor and sensory. Minimal attention  deficits while on ritalin. Memory functional Musculoskeletal:right lumbar paraspinals in spasms. Has difficulties with flexion and right lateral bending. Less pain with twisting and extension. SLR negative.  Psych:pleasant and polite as always    Assessment & Plan:  1. 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  2. BPPV related to the above  3. 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 Has stress and sinus contributions as well.  4. Memory issues likely related to activity levels, attention, sleep, etc. She is also linking it to some dementia her mother is suffering from now as well.  5. Low back strain--resolved 6. Bilateral arm pain and numbness. Mild radiculitis vs neuropraxic irritation from sleeping positions   Plan:  1. Maxalt prn for migraines has been effective. Off topamax due to kidney stones 2. Recent lower cervical pain/radicular symptoms--intermittent             -improved with decreased physical stress/change in jobs  -discussed reasonable activities, positions while sleeping, etc 3. Continue tramadol for breakthrough headaches, generalized pain. --refilled today 4. Given her new job adjust ritalin immediate release, 20mg  Bid #75 to allow 30mg  in AM. Second RF were given today.  6. I will see her back in 4 months. I spent 15 minutes today. All questions were encouraged and answered. She may come pick an rx for ritalin prior to her next appt,   Reminded her that she needs to be regular with vestibular exercises

## 2016-06-28 LAB — TOXASSURE SELECT,+ANTIDEPR,UR

## 2016-06-30 ENCOUNTER — Telehealth: Payer: Self-pay

## 2016-06-30 NOTE — Telephone Encounter (Signed)
I will discuss with her at next visit

## 2016-06-30 NOTE — Telephone Encounter (Signed)
Urine drug screen for this encounter is Inconsistent. Patient UDS  came back with Metabolites of EtG and EtS. Which indicates  the presences of alcohol. Please see lab reports for details and advise

## 2016-07-03 NOTE — Telephone Encounter (Signed)
Ptns DOB listed in our files as 1.2.1957-- [facility] - please confirm with patient which is accurate

## 2016-09-07 ENCOUNTER — Other Ambulatory Visit: Payer: Self-pay

## 2016-09-07 ENCOUNTER — Telehealth: Payer: Self-pay | Admitting: Registered Nurse

## 2016-09-07 DIAGNOSIS — F908 Attention-deficit hyperactivity disorder, other type: Secondary | ICD-10-CM

## 2016-09-07 MED ORDER — METHYLPHENIDATE HCL 20 MG PO TABS: 20.0000 mg | ORAL_TABLET | ORAL | 0 refills | Status: DC

## 2016-09-07 NOTE — Telephone Encounter (Signed)
Patient called requesting a refill of methylphenidate, next appointment with Dr. Riley KillSwartz is in April 4th, according to last note she may come in and pick up a prescription prior to next appointment.

## 2016-09-07 NOTE — Telephone Encounter (Signed)
[name] requested Ritalin re-fill, [facility] reviewed last prescription filled on 08/05/2016. According to Dr. Riley KillSwartz note she was given permission to pick up prescription. Prescription printed, [name] was called and verbalizes understanding.

## 2016-09-07 NOTE — Telephone Encounter (Signed)
Printed prescription for patient according to last doctors note and had Riley LamEunice NP sign for it, called patient and informed her it was ready for pick up

## 2016-10-16 ENCOUNTER — Telehealth: Payer: Self-pay | Admitting: Physical Medicine & Rehabilitation

## 2016-10-16 NOTE — Telephone Encounter (Signed)
Patient left voicemail 03.30.18 at 12:14 pm states someone left voicemail cannot understand what call is for - please return call [phone]

## 2016-10-18 ENCOUNTER — Encounter: Payer: 59 | Attending: Physical Medicine & Rehabilitation | Admitting: Physical Medicine & Rehabilitation

## 2016-10-18 ENCOUNTER — Encounter: Payer: Self-pay | Admitting: Physical Medicine & Rehabilitation

## 2016-10-18 VITALS — BP 152/80 | HR 78 | Resp 14

## 2016-10-18 DIAGNOSIS — Z87891 Personal history of nicotine dependence: Secondary | ICD-10-CM | POA: Insufficient documentation

## 2016-10-18 DIAGNOSIS — S065X9S Traumatic subdural hemorrhage with loss of consciousness of unspecified duration, sequela: Secondary | ICD-10-CM | POA: Insufficient documentation

## 2016-10-18 DIAGNOSIS — M79602 Pain in left arm: Secondary | ICD-10-CM | POA: Insufficient documentation

## 2016-10-18 DIAGNOSIS — M79601 Pain in right arm: Secondary | ICD-10-CM | POA: Insufficient documentation

## 2016-10-18 DIAGNOSIS — Z79899 Other long term (current) drug therapy: Secondary | ICD-10-CM | POA: Diagnosis not present

## 2016-10-18 DIAGNOSIS — M5030 Other cervical disc degeneration, unspecified cervical region: Secondary | ICD-10-CM

## 2016-10-18 DIAGNOSIS — X58XXXS Exposure to other specified factors, sequela: Secondary | ICD-10-CM | POA: Insufficient documentation

## 2016-10-18 DIAGNOSIS — G43109 Migraine with aura, not intractable, without status migrainosus: Secondary | ICD-10-CM | POA: Diagnosis not present

## 2016-10-18 DIAGNOSIS — S065X2D Traumatic subdural hemorrhage with loss of consciousness of 31 minutes to 59 minutes, subsequent encounter: Secondary | ICD-10-CM | POA: Diagnosis not present

## 2016-10-18 DIAGNOSIS — Z5181 Encounter for therapeutic drug level monitoring: Secondary | ICD-10-CM

## 2016-10-18 DIAGNOSIS — G43009 Migraine without aura, not intractable, without status migrainosus: Secondary | ICD-10-CM

## 2016-10-18 DIAGNOSIS — Z9884 Bariatric surgery status: Secondary | ICD-10-CM | POA: Insufficient documentation

## 2016-10-18 DIAGNOSIS — R200 Anesthesia of skin: Secondary | ICD-10-CM | POA: Diagnosis not present

## 2016-10-18 DIAGNOSIS — F329 Major depressive disorder, single episode, unspecified: Secondary | ICD-10-CM | POA: Insufficient documentation

## 2016-10-18 DIAGNOSIS — H8113 Benign paroxysmal vertigo, bilateral: Secondary | ICD-10-CM

## 2016-10-18 DIAGNOSIS — F908 Attention-deficit hyperactivity disorder, other type: Secondary | ICD-10-CM

## 2016-10-18 DIAGNOSIS — G43001 Migraine without aura, not intractable, with status migrainosus: Secondary | ICD-10-CM

## 2016-10-18 MED ORDER — TRAMADOL HCL 50 MG PO TABS: ORAL_TABLET | ORAL | 3 refills | Status: DC

## 2016-10-18 MED ORDER — METHYLPHENIDATE HCL 20 MG PO TABS: 20.0000 mg | ORAL_TABLET | ORAL | 0 refills | Status: DC

## 2016-10-18 NOTE — Patient Instructions (Signed)
PLEASE FEEL FREE TO CALL OUR OFFICE WITH ANY PROBLEMS OR QUESTIONS ([phone])      

## 2016-10-18 NOTE — Progress Notes (Signed)
Subjective:    Patient ID: Jenna Stafford, female    DOB: 1955/08/10, 61 y.o.   MRN: [id number]  HPI   Jenna Stafford is here in follow up of her TBI and associated deficits. She broke a molar a couple weeks ago which required oral surgery intervention. She had it pulled and course was complicated by perforated maxillary sinus. She was placed on hydrocodone for pain control but she forgot to call our office.   Her work is going fairly well. She is working and managing a doctor's office currently. She sometimes struggles with organization and short term memory.   She is using tramadol for headache mgt. Her arms and neck have been better as she's been more realistic about her activity.   Pain Inventory Average Pain 8 Pain Right Now 0 My pain is dull and aching  In the last 24 hours, has pain interfered with the following? General activity 5 Relation with others 5 Enjoyment of life 7 What TIME of day is your pain at its worst? morning, night Sleep (in general) Good  Pain is worse with: walking and bending Pain improves with: rest and medication Relief from Meds: 10  Mobility walk without assistance how many minutes can you walk? unlimited ability to climb steps?  yes do you drive?  yes Do you have any goals in this area?  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  no  Neuro/Psych dizziness  Prior Studies Any changes since last visit?  no  Physicians involved in your care Any changes since last visit?  no   Family History  Problem Relation Age of Onset  . Heart disease Mother   . COPD Mother   . Other Father   . Other Brother    Social History   Social History  . Marital status: Married    Spouse name: N/A  . Number of children: N/A  . Years of education: N/A   Social History Main Topics  . Smoking status: Former Smoker    Packs/day: 0.50    Years: 1.00    Types: Cigarettes    Quit date: 08/02/2012  .  Smokeless tobacco: Never Used  . Alcohol use No  . Drug use: No  . Sexual activity: Not Asked   Other Topics Concern  . None   Social History Narrative  . None   Past Surgical History:  Procedure Laterality Date  . ABDOMINAL HYSTERECTOMY    . APPENDECTOMY    . ARTHROSCOPIC REPAIR ACL     right side  . CESAREAN SECTION    . GASTRIC BYPASS    . KNEE SURGERY     left   Past Medical History:  Diagnosis Date  . Depression    BP (!) 152/80 (BP Location: Left Arm, Patient Position: Sitting, Cuff Size: Normal)   Pulse 78   Resp 14   SpO2 97%   Opioid Risk Score:   Fall Risk Score:  `1  Depression screen PHQ 2/9  Depression screen [facility] 2/9 02/10/2015 02/10/2015  Decreased Interest 0 0  Down, Depressed, Hopeless 0 0  PHQ - 2 Score 0 0  Altered sleeping 0 -  Tired, decreased energy 0 -  Change in appetite 0 -  Feeling bad or failure about yourself  0 -  Trouble concentrating 0 -  Moving slowly or fidgety/restless 0 -  Suicidal thoughts 0 -  PHQ-9 Score 0 -  Review of Systems  Constitutional: Negative.   HENT: Positive for dental problem.   Eyes: Negative.   Respiratory: Negative.   Cardiovascular: Negative.   Gastrointestinal: Negative.   Endocrine: Negative.   Genitourinary: Negative.   Musculoskeletal: Negative.   Skin: Negative.   Allergic/Immunologic: Negative.   Neurological: Positive for dizziness.  Hematological: Negative.   Psychiatric/Behavioral: Negative.   All other systems reviewed and are negative.      Objective:   Physical Exam  General: no distress HEENT:PERRL  Neck:Supple without JVD or lymphadenopathy  Heart:RRR  Chest:CTA B  Abdomen:Soft, non-tender, non-distended, bowel sounds positive.  Extremities:No clubbing, cyanosis, or edema. Pulses are 2+  Skin:Clean and intact without signs of breakdown  Neuro:normal motor and sensory. Minimal attention deficits while on ritalin. Memory functional Musculoskeletal: low back and  cervical rom fairly functional today Psych:pleasant and polite as always    Assessment & Plan:  1. 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  2. BPPV related to the above  3. 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 Has stress and sinus contributions as well.  4. Memory issues likely related to activity levels, attention, sleep, etc. Family history of dementia.  5. Low back strain--resolved 6. Bilateral arm pain and numbness. Mild radiculitis vs neuropraxic irritation from sleeping positions   Plan:  1. Maxalt prn for migraines has been effective. Off topamax due to kidney stones 2. Recent lower cervical pain/radicular symptoms--intermittent -improved with reasonable activity 3. Continue tramadol for breakthrough headaches, generalized pain.   4. Continue ritalin immediate release,  Bid #75 to allow  in AM. Second RF were given today. Reminded her to contact our office if she is given any controlled substances from another provider.  She will need to employ compensatory strategies as well to help her cognitive function.  6. I will see her back in 4 months. I spent 15 minutes today. All questions were encouraged and answered. She may come pick an rx for ritalin prior to her next appt,

## 2016-10-26 LAB — TOXASSURE SELECT,+ANTIDEPR,UR

## 2016-10-30 ENCOUNTER — Telehealth: Payer: Self-pay | Admitting: Physical Medicine & Rehabilitation

## 2016-10-30 NOTE — Telephone Encounter (Signed)
I will discuss ETOH on UDS with patient at her next visit with me.

## 2016-10-31 NOTE — Telephone Encounter (Addendum)
Urine drug screen for this encounter is inconsistent. Jenna Stafford shows positive for alcohol and unprescribed benzodiazepine metabolite oxazepam. N[facility]SR reviewed since 2015 and no Rx for diazepam, temazepam or Tranzene which all metabolize oxazepam. This is the second consecutive urine drug screen to be positive for alcohol and an unprescribed medication. She had one in 02/10/15  had unprescribed fentanyl in UDS.

## 2016-12-21 ENCOUNTER — Telehealth: Payer: Self-pay | Admitting: *Deleted

## 2016-12-21 NOTE — Telephone Encounter (Addendum)
Jenna Stafford has called to pick up her Ritalin (last seen 10/18/16).I dont know if you remember that I sent you a message on 10/30/16 that "Urine drug screen for this 10/18/16 encounter is inconsistent. Jenna Stafford shows positive for alcohol and an unprescribed benzodiazepine metabolite. N[facility]SR reviewed since 2015 and no Rx for diazepam, temazepam or Tranzene which all metabolize oxazepam. This is the second consecutive urine drug screen to be positive for alcohol and an unprescribed benzodiazepine and the UDS prior to these (on 02/10/15) had unprescribed fentanyl in UDS. This is a total of three consecutive inconsistent UDS. Jenna Stafford is more than willing to see her and give her one Rx and do a retest. She is not scheduled to come back until 02/14/17. Please advise.

## 2016-12-22 NOTE — Telephone Encounter (Signed)
Will notify her.  Thanks  Left message for her explaining that she must come in and see Riley Lamunice for Rx and we have appt open at 2pm.

## 2016-12-22 NOTE — Telephone Encounter (Signed)
I would like for Jenna Stafford to see her and re-test. Make sure she's clear regarding the potential ramifications of another positive test===ie, we no longer rx ritalin

## 2016-12-25 ENCOUNTER — Encounter: Payer: 59 | Admitting: Registered Nurse

## 2016-12-26 ENCOUNTER — Encounter: Payer: 59 | Attending: Physical Medicine & Rehabilitation | Admitting: Registered Nurse

## 2016-12-26 ENCOUNTER — Encounter: Payer: Self-pay | Admitting: Registered Nurse

## 2016-12-26 VITALS — BP 150/76 | HR 81

## 2016-12-26 DIAGNOSIS — Z9884 Bariatric surgery status: Secondary | ICD-10-CM | POA: Diagnosis not present

## 2016-12-26 DIAGNOSIS — M79601 Pain in right arm: Secondary | ICD-10-CM | POA: Diagnosis not present

## 2016-12-26 DIAGNOSIS — G43109 Migraine with aura, not intractable, without status migrainosus: Secondary | ICD-10-CM | POA: Insufficient documentation

## 2016-12-26 DIAGNOSIS — F329 Major depressive disorder, single episode, unspecified: Secondary | ICD-10-CM | POA: Insufficient documentation

## 2016-12-26 DIAGNOSIS — Z5181 Encounter for therapeutic drug level monitoring: Secondary | ICD-10-CM

## 2016-12-26 DIAGNOSIS — Z87891 Personal history of nicotine dependence: Secondary | ICD-10-CM | POA: Diagnosis not present

## 2016-12-26 DIAGNOSIS — S065X1S Traumatic subdural hemorrhage with loss of consciousness of 30 minutes or less, sequela: Secondary | ICD-10-CM

## 2016-12-26 DIAGNOSIS — Z79899 Other long term (current) drug therapy: Secondary | ICD-10-CM | POA: Diagnosis not present

## 2016-12-26 DIAGNOSIS — S065X9S Traumatic subdural hemorrhage with loss of consciousness of unspecified duration, sequela: Secondary | ICD-10-CM | POA: Insufficient documentation

## 2016-12-26 DIAGNOSIS — G43001 Migraine without aura, not intractable, with status migrainosus: Secondary | ICD-10-CM | POA: Diagnosis not present

## 2016-12-26 DIAGNOSIS — M79602 Pain in left arm: Secondary | ICD-10-CM | POA: Diagnosis not present

## 2016-12-26 DIAGNOSIS — R200 Anesthesia of skin: Secondary | ICD-10-CM | POA: Diagnosis not present

## 2016-12-26 DIAGNOSIS — F908 Attention-deficit hyperactivity disorder, other type: Secondary | ICD-10-CM

## 2016-12-26 MED ORDER — METHYLPHENIDATE HCL 20 MG PO TABS: 20.0000 mg | ORAL_TABLET | ORAL | 0 refills | Status: DC

## 2016-12-26 NOTE — Progress Notes (Signed)
Subjective:    Patient ID: Jenna Stafford, female    DOB: Aug 17, 1955, 61 y.o.   MRN: [id number]  HPI: [name] is a 61 year old female who returns for follow up appointment and medication refill. She denies any pain at this time.  Her current exercise regime is walking. Discuss in detail with [name] regarding her UDS results. She admits she had an alcoholic drink yesterday after her dinner and admits she drinks about three days a week.   Also states her mother passed away in 10/14/2017and her mother-in- law passed away 09/27/2016. States she has been dealing with stress and family issues. 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 Instructed to reach out to her sponsor, she verbalizes understanding.  Also states she had to go see about her sister on last Friday ( 12/22/2016), she has forgot her medication and states she had a half of her sister Adderall. An oral Swab was obtained, she realizes she has violated the narcotic contract.  The above was discussed with Dr. Riley Stafford as well, if her UDS is inconsistent she will be discharge. [name] is aware she may be discharged from our office, if UDS is inconsistent. We discussed counseling at this time as well. She verbalizes understanding.    Pain Inventory Average Pain 8 Pain Right Now 0 My pain is dull and aching  In the last 24 hours, has pain interfered with the following? General activity 6 Relation with others 6 Enjoyment of life 8 What TIME of day is your pain at its worst? night Sleep (in general) Good  Pain is worse with: walking, bending and standing Pain improves with: rest and medication Relief from Meds: 10  Mobility walk without assistance how many minutes can you walk? unlimited ability to climb steps?  yes do you drive?  yes Do you have any goals in this area?  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  no  Neuro/Psych numbness dizziness  Prior Studies Any changes since last visit?  no  Physicians involved in your care Any changes since last visit?  no   Family History  Problem Relation Age of Onset  . Heart disease Mother   . COPD Mother   . Other Father   . Other Brother    Social History   Social History  . Marital status: Married    Spouse name: N/A  . Number of children: N/A  . Years of education: N/A   Social History Main Topics  . Smoking status: Former Smoker    Packs/day: 0.50    Years: 1.00    Types: Cigarettes    Quit date: 08/02/2012  . Smokeless tobacco: Never Used  . Alcohol use No  . Drug use: No  . Sexual activity: Not Asked   Other Topics Concern  . None   Social History Narrative  . None   Past Surgical History:  Procedure Laterality Date  . ABDOMINAL HYSTERECTOMY    . APPENDECTOMY    . ARTHROSCOPIC REPAIR ACL     right side  . CESAREAN SECTION    . GASTRIC BYPASS    . KNEE SURGERY     left   Past Medical History:  Diagnosis Date  . Depression    BP (!) 150/76 (BP Location: Right  Arm, Patient Position: Sitting, Cuff Size: Normal)   Pulse 81   SpO2 98%   Opioid Risk Score:   Fall Risk Score:  `1  Depression screen PHQ 2/9  [facility] 02/10/2015 02/10/2015  Decreased Interest 0 0  Down, Depressed, Hopeless 0 0  PHQ - 2 Score 0 0  Altered sleeping 0 -  Tired, decreased energy 0 -  Change in appetite 0 -  Feeling bad or failure about yourself  0 -  Trouble concentrating 0 -  Moving slowly or fidgety/restless 0 -  Suicidal thoughts 0 -  PHQ-9 Score 0 -    Review of Systems  Constitutional: Negative.   HENT: Negative.   Eyes: Negative.   Respiratory: Negative.   Cardiovascular: Negative.   Gastrointestinal: Negative.   Endocrine: Negative.   Genitourinary: Negative.   Musculoskeletal: Negative.   Allergic/Immunologic: Negative.   Neurological: Positive for dizziness,  numbness and headaches.  Hematological: Negative.   Psychiatric/Behavioral: Negative.        Objective:   Physical Exam  Constitutional: She appears well-developed and well-nourished.  HENT:  Head: Normocephalic and atraumatic.  Neck: Normal range of motion. Neck supple.  Musculoskeletal:  Norma;l Muscle Bulk and Muscle Testing Reveals: Upper Extremities: Full ROM and Muscle Strength 5/5 Lower Extremities: Full ROM and Muscle Strength 5/5 Arises from Table with ease Narrow Based Gait  Nursing note and vitals reviewed.         Assessment & Plan:  1. 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 12/26/2016 2. BPPV related to the above: No complaints today. 12/26/2016 3. 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 12/26/2016 4. Memory issues likely related to activity levels, attention, sleep, etc. Continue Ritalin 20 mg 1.5 tablets in am and one tablet at noon. twice a day #75. 12/26/2016  60 minutes of face to face patient care time was spent during this visit. All questions were encouraged and answered.    F/U in 2 months

## 2016-12-30 LAB — DRUG TOX MONITOR 1 W/CONF, ORAL FLD
Amphetamines: NEGATIVE ng/mL (ref <10)
Barbiturates: NEGATIVE ng/mL (ref <10)
Benzodiazepines: NEGATIVE ng/mL (ref <0.50)
Buprenorphine: NEGATIVE ng/mL (ref <0.025)
Cocaine: NEGATIVE ng/mL (ref <2.5)
Cotinine: 13.3 ng/mL — ABNORMAL HIGH (ref <5.0)
Fentanyl: NEGATIVE ng/mL (ref <0.10)
Heroin Metabolite: NEGATIVE ng/mL (ref <1.0)
MDMA: NEGATIVE ng/mL (ref <10)
Marijuana: NEGATIVE ng/mL (ref <2.5)
Meperidine: NEGATIVE ng/mL (ref <5.0)
Meprobamate: NEGATIVE ng/mL (ref <2.5)
Methadone: NEGATIVE ng/mL (ref <5.0)
Nicotine Metabolite: POSITIVE ng/mL — AB (ref <5.0)
Opiates: NEGATIVE ng/mL (ref <2.5)
Phencyclidine: NEGATIVE ng/mL (ref <10)
Propoxyphene: NEGATIVE ng/mL (ref <5.0)
Tapentadol: NEGATIVE ng/mL (ref <5.0)
Tramadol: 465.1 ng/mL — ABNORMAL HIGH (ref <5.0)
Tramadol: POSITIVE ng/mL — AB (ref <5.0)
Zolpidem: NEGATIVE ng/mL (ref <5.0)

## 2016-12-30 LAB — DRUG TOX ALC METAB W/CON, ORAL FLD: Alcohol Metabolite: NEGATIVE ng/mL (ref <25)

## 2016-12-30 LAB — DRUG TOX METHYLPHEN W/CONF,ORAL FLD
Methylphenidate: 17.6 ng/mL — ABNORMAL HIGH (ref <1.0)
Methylphenidate: POSITIVE ng/mL — AB (ref <1.0)

## 2017-01-02 ENCOUNTER — Telehealth: Payer: Self-pay | Admitting: *Deleted

## 2017-01-02 NOTE — Telephone Encounter (Signed)
Oral swab drug screen for this encounter is consistent for prescribed medication. 

## 2017-02-13 ENCOUNTER — Ambulatory Visit: Payer: 59 | Admitting: Registered Nurse

## 2017-02-14 ENCOUNTER — Encounter: Payer: Self-pay | Admitting: Physical Medicine & Rehabilitation

## 2017-02-14 ENCOUNTER — Encounter: Payer: 59 | Attending: Physical Medicine & Rehabilitation | Admitting: Physical Medicine & Rehabilitation

## 2017-02-14 VITALS — BP 105/58 | HR 83

## 2017-02-14 DIAGNOSIS — F908 Attention-deficit hyperactivity disorder, other type: Secondary | ICD-10-CM | POA: Diagnosis not present

## 2017-02-14 DIAGNOSIS — R200 Anesthesia of skin: Secondary | ICD-10-CM | POA: Insufficient documentation

## 2017-02-14 DIAGNOSIS — H8110 Benign paroxysmal vertigo, unspecified ear: Secondary | ICD-10-CM

## 2017-02-14 DIAGNOSIS — Z87820 Personal history of traumatic brain injury: Secondary | ICD-10-CM | POA: Diagnosis not present

## 2017-02-14 DIAGNOSIS — S065X9S Traumatic subdural hemorrhage with loss of consciousness of unspecified duration, sequela: Secondary | ICD-10-CM | POA: Diagnosis present

## 2017-02-14 DIAGNOSIS — M79601 Pain in right arm: Secondary | ICD-10-CM | POA: Insufficient documentation

## 2017-02-14 DIAGNOSIS — M79602 Pain in left arm: Secondary | ICD-10-CM | POA: Diagnosis not present

## 2017-02-14 DIAGNOSIS — Z9884 Bariatric surgery status: Secondary | ICD-10-CM | POA: Insufficient documentation

## 2017-02-14 DIAGNOSIS — Z87891 Personal history of nicotine dependence: Secondary | ICD-10-CM | POA: Diagnosis not present

## 2017-02-14 DIAGNOSIS — F329 Major depressive disorder, single episode, unspecified: Secondary | ICD-10-CM | POA: Insufficient documentation

## 2017-02-14 DIAGNOSIS — G43109 Migraine with aura, not intractable, without status migrainosus: Secondary | ICD-10-CM | POA: Diagnosis not present

## 2017-02-14 MED ORDER — METHYLPHENIDATE HCL 20 MG PO TABS: 20.0000 mg | ORAL_TABLET | ORAL | 0 refills | Status: DC

## 2017-02-14 NOTE — Patient Instructions (Signed)
PLEASE FEEL FREE TO CALL OUR OFFICE WITH ANY PROBLEMS OR QUESTIONS ([phone])      

## 2017-02-14 NOTE — Progress Notes (Signed)
Subjective:    Patient ID: Jenna Stafford, female    DOB: 12/25/1955, 61 y.o.   MRN: [id number]  HPI   Jenna Stafford is here in follow up of her TBI and associated deficits. She has tested positive for EtOH on two occasions. She has been dealing with stresses with work/home and turned to drinking as an outlet. She is apolgetic for the mistakes and has been working with a Veterinary surgeoncounselor and with her [employer] group and sponsor.   She remains on ritlain for her attention and concentration which is beneficial.   Her migraines are 6-10 per month and maxalt is effective for relief  She admits that stress increases her headaches. She does feel that work stress is improving as she's becoming more comfortable with the work.     Pain Inventory Average Pain 9 Pain Right Now 0 My pain is dull and aching  In the last 24 hours, has pain interfered with the following? General activity 4 Relation with others 4 Enjoyment of life 4 What TIME of day is your pain at its worst? night Sleep (in general) Good  Pain is worse with: walking, bending and standing Pain improves with: rest and medication Relief from Meds: 10  Mobility walk without assistance ability to climb steps?  yes do you drive?  yes  Function employed # of hrs/week 50  Neuro/Psych dizziness  Prior Studies Any changes since last visit?  no  Physicians involved in your care Any changes since last visit?  no   Family History  Problem Relation Age of Onset  . Heart disease Mother   . COPD Mother   . Other Father   . Other Brother    Social History   Social History  . Marital status: Married    Spouse name: N/A  . Number of children: N/A  . Years of education: N/A   Social History Main Topics  . Smoking status: Former Smoker    Packs/day: 0.50    Years: 1.00    Types: Cigarettes    Quit date: 08/02/2012  . Smokeless tobacco: Never Used  . Alcohol use No  . Drug use: No  . Sexual activity: Not on file   Other Topics  Concern  . Not on file   Social History Narrative  . No narrative on file   Past Surgical History:  Procedure Laterality Date  . ABDOMINAL HYSTERECTOMY    . APPENDECTOMY    . ARTHROSCOPIC REPAIR ACL     right side  . CESAREAN SECTION    . GASTRIC BYPASS    . KNEE SURGERY     left   Past Medical History:  Diagnosis Date  . Depression    There were no vitals taken for this visit.  Opioid Risk Score:   Fall Risk Score:  `1  Depression screen PHQ 2/9  [facility] 02/10/2015 02/10/2015  Decreased Interest 0 0  Down, Depressed, Hopeless 0 0  PHQ - 2 Score 0 0  Altered sleeping 0 -  Tired, decreased energy 0 -  Change in appetite 0 -  Feeling bad or failure about yourself  0 -  Trouble concentrating 0 -  Moving slowly or fidgety/restless 0 -  Suicidal thoughts 0 -  PHQ-9 Score 0 -     Review of Systems  Constitutional: Negative.   HENT: Negative.   Eyes: Negative.   Respiratory: Negative.   Cardiovascular: Negative.   Gastrointestinal: Negative.   Endocrine: Negative.   Genitourinary: Negative.  Musculoskeletal: Negative.   Skin: Negative.   Allergic/Immunologic: Negative.   Neurological: Negative.   Hematological: Negative.   Psychiatric/Behavioral: Negative.   All other systems reviewed and are negative.      Objective:   Physical Exam  General: no distress HEENT:PERRL Neck:Supple without JVD or lymphadenopathy  Heart:RRR Chest:CTA B Abdomen:Soft, non-tender, non-distended, bowel sounds positive.  Extremities:No clubbing, cyanosis, or edema. Pulses are 2+  Skin:Clean and intact without signs of breakdown  Neuro:normal motor and sensory. Minimal attention deficits while on ritalin. Memory functional Musculoskeletal: low back and cervical rom fairly functional today Psych:pleasant, a little tearful   Assessment & Plan:  1. 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  2. BPPV related to the above  3. 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 Has stress and sinus contributions as well.  4. Memory issues likely related to activity levels, attention, sleep, etc. Family history of dementia.  5. Low back strain--resolved 6. Bilateral arm pain and numbness. Mild radiculitis vs neuropraxic irritation from sleeping positions 7. ETOH abuse   Plan:  1. Maxalt prn for migraines has been effective.   2. Recent lower cervical pain/radicular symptoms--intermittent -HEP to continue 3. Continue tramadol for breakthrough headaches, generalized pain.  4. Continueritalin immediate release, 20mg  Bid #75 to allow 30mg  in AM.  She is aware that another positive test will mean discontinuing of ritalin 5. We discussed techniques for relaxation/meditation today. If needed she is willing to see neuropsych to help as well. For now she'll continue with her AAA and church counselor. She was asked to call if needed if in distress of any kind 6. I will have her see our NP 2 months. I spent 15 minutes today. All questions were encouraged and answered. She may come pick an rx for ritalin prior to her next appt

## 2017-03-12 ENCOUNTER — Other Ambulatory Visit: Payer: Self-pay | Admitting: Physical Medicine & Rehabilitation

## 2017-03-12 DIAGNOSIS — G43009 Migraine without aura, not intractable, without status migrainosus: Secondary | ICD-10-CM

## 2017-03-12 DIAGNOSIS — H8113 Benign paroxysmal vertigo, bilateral: Secondary | ICD-10-CM

## 2017-03-12 DIAGNOSIS — F908 Attention-deficit hyperactivity disorder, other type: Secondary | ICD-10-CM

## 2017-04-11 ENCOUNTER — Telehealth: Payer: Self-pay | Admitting: *Deleted

## 2017-04-11 NOTE — Telephone Encounter (Signed)
Jenna Stafford called to report that she has had an incomplete root canal and then had to have tooth removed.  They gave her # 6 hydrocodone and clindamycin.  Just an FYI.

## 2017-04-16 ENCOUNTER — Encounter: Payer: Self-pay | Admitting: Registered Nurse

## 2017-04-16 ENCOUNTER — Encounter: Payer: 59 | Attending: Physical Medicine & Rehabilitation | Admitting: Registered Nurse

## 2017-04-16 ENCOUNTER — Other Ambulatory Visit: Payer: Self-pay

## 2017-04-16 VITALS — BP 111/73 | HR 96 | Resp 14

## 2017-04-16 DIAGNOSIS — M79602 Pain in left arm: Secondary | ICD-10-CM | POA: Diagnosis not present

## 2017-04-16 DIAGNOSIS — S065X9S Traumatic subdural hemorrhage with loss of consciousness of unspecified duration, sequela: Secondary | ICD-10-CM | POA: Diagnosis not present

## 2017-04-16 DIAGNOSIS — Z9884 Bariatric surgery status: Secondary | ICD-10-CM | POA: Insufficient documentation

## 2017-04-16 DIAGNOSIS — F329 Major depressive disorder, single episode, unspecified: Secondary | ICD-10-CM | POA: Insufficient documentation

## 2017-04-16 DIAGNOSIS — Z87891 Personal history of nicotine dependence: Secondary | ICD-10-CM | POA: Diagnosis not present

## 2017-04-16 DIAGNOSIS — M79601 Pain in right arm: Secondary | ICD-10-CM | POA: Diagnosis not present

## 2017-04-16 DIAGNOSIS — Z5181 Encounter for therapeutic drug level monitoring: Secondary | ICD-10-CM | POA: Diagnosis not present

## 2017-04-16 DIAGNOSIS — G43001 Migraine without aura, not intractable, with status migrainosus: Secondary | ICD-10-CM | POA: Diagnosis not present

## 2017-04-16 DIAGNOSIS — Z79899 Other long term (current) drug therapy: Secondary | ICD-10-CM | POA: Diagnosis not present

## 2017-04-16 DIAGNOSIS — S065X0A Traumatic subdural hemorrhage without loss of consciousness, initial encounter: Secondary | ICD-10-CM

## 2017-04-16 DIAGNOSIS — F908 Attention-deficit hyperactivity disorder, other type: Secondary | ICD-10-CM | POA: Diagnosis not present

## 2017-04-16 DIAGNOSIS — R200 Anesthesia of skin: Secondary | ICD-10-CM | POA: Diagnosis not present

## 2017-04-16 DIAGNOSIS — S065X1S Traumatic subdural hemorrhage with loss of consciousness of 30 minutes or less, sequela: Secondary | ICD-10-CM

## 2017-04-16 DIAGNOSIS — G43109 Migraine with aura, not intractable, without status migrainosus: Secondary | ICD-10-CM | POA: Diagnosis not present

## 2017-04-16 DIAGNOSIS — Z87820 Personal history of traumatic brain injury: Secondary | ICD-10-CM

## 2017-04-16 MED ORDER — METHYLPHENIDATE HCL 20 MG PO TABS: ORAL_TABLET | ORAL | 0 refills | Status: DC

## 2017-04-16 NOTE — Progress Notes (Signed)
Subjective:    Patient ID: Jenna Stafford, female    DOB: 08/11/1955, 61 y.o.   MRN: [id number]  HPI: Jenna Stafford is a 61 year old female who returns for follow up appointment and medication refill. She reports dental pain, she had three teeth extracted on 09/ 27/2018, Dr. Orvan Falconer prescribed hydrocodone. Jenna Stafford informed the office, also states Dr. Yetta Barre prescribed Oxycodone on 04/11/2017,this was not documented on the PMP Aware Website, [facility] was called, they will be reaching out to the PMP Site.  Her current exercise regime is walking. Jenna Stafford continues with AAA meetings , also reports she is going daily.   Jenna Stafford Morphine equivalent is .  Pain Inventory Average Pain 8 Pain Right Now 8 My pain is sharp, dull and aching  In the last 24 hours, has pain interfered with the following? General activity 4 Relation with others 6 Enjoyment of life 5 What TIME of day is your pain at its worst? morning, night Sleep (in general) Good  Pain is worse with: walking, bending and standing Pain improves with: rest and medication Relief from Meds: 9  Mobility walk without assistance how many minutes can you walk? unlimited ability to climb steps?  yes do you drive?  yes Do you have any goals in this area?  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  no  Neuro/Psych bladder control problems numbness dizziness  Prior Studies Any changes since last visit?  no  Physicians involved in your care Any changes since last visit?  no   Family History  Problem Relation Age of Onset  . Heart disease Mother   . COPD Mother   . Other Father   . Other Brother    Social History   Social History  . Marital status: Married    Spouse name: N/A  . Number of children: N/A  . Years of education: N/A   Social History Main Topics  . Smoking status: Former Smoker    Packs/day: 0.50    Years: 1.00   Types: Cigarettes    Quit date: 08/02/2012  . Smokeless tobacco: Never Used  . Alcohol use No  . Drug use: No  . Sexual activity: Not Asked   Other Topics Concern  . None   Social History Narrative  . None   Past Surgical History:  Procedure Laterality Date  . ABDOMINAL HYSTERECTOMY    . APPENDECTOMY    . ARTHROSCOPIC REPAIR ACL     right side  . CESAREAN SECTION    . GASTRIC BYPASS    . KNEE SURGERY     left   Past Medical History:  Diagnosis Date  . Depression    BP 111/73   Pulse 96   Resp 14   SpO2 98%   Opioid Risk Score:   Fall Risk Score:  `1  Depression screen PHQ 2/9  Depression screen [facility] 2/9 02/10/2015 02/10/2015  Decreased Interest 0 0  Down, Depressed, Hopeless 0 0  PHQ - 2 Score 0 0  Altered sleeping 0 -  Tired, decreased energy 0 -  Change in appetite 0 -  Feeling bad or failure about yourself  0 -  Trouble concentrating 0 -  Moving slowly or fidgety/restless 0 -  Suicidal thoughts 0 -  PHQ-9 Score 0 -    Review of Systems  Constitutional: Negative.   HENT: Positive for dental problem.  Eyes: Negative.   Respiratory: Negative.   Cardiovascular: Negative.   Gastrointestinal: Negative.   Endocrine: Negative.   Genitourinary: Negative.   Musculoskeletal: Negative.   Allergic/Immunologic: Negative.   Neurological: Positive for dizziness, numbness and headaches.  Hematological: Negative.   Psychiatric/Behavioral: Negative.        Objective:   Physical Exam  Constitutional: She is oriented to person, place, and time. She appears well-developed and well-nourished.  HENT:  Head: Normocephalic and atraumatic.  Neck: Normal range of motion. Neck supple.  Cardiovascular: Normal rate and regular rhythm.   Pulmonary/Chest: Effort normal and breath sounds normal.  Musculoskeletal:  Norma;l Muscle Bulk and Muscle Testing Reveals: Upper Extremities: Full  ROM and Muscle Strength 5/5 Lower Extremities: Full ROM and Muscle Strength  5/5 Arises from Table with ease Narrow Based Gait  Neurological: She is alert and oriented to person, place, and time.  Skin: Skin is warm and dry.  Psychiatric: She has a normal mood and affect.  Nursing note and vitals reviewed.         Assessment & Plan:  1. 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 04/16/2017 2. BPPV related to the above: No complaints today. 04/16/2017 3. 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 04/16/2017 4. Memory issues likely related to activity levels, attention, sleep, etc. Continue Ritalin 20 mg 1.5 tablets in am and one tablet at noon. twice a day #75.  She has permission to come to  Pick up her Ritalin prescription prior to her next appointment per Dr. Riley Kill note on 02/14/2017.  04/16/2017  20 minutes of face to face patient care time was spent during this visit. All questions were encouraged and answered.   F/U in 2 months

## 2017-04-18 ENCOUNTER — Telehealth: Payer: Self-pay | Admitting: Registered Nurse

## 2017-04-18 NOTE — Telephone Encounter (Signed)
On 04/18/2017 the  NCCSR was reviewed no conflict was seen on the [facility] Controlled Substance Reporting System. [name] had dental and oral surgery performed. Our office was aware of the above. [name] has a signed narcotic contract with our office. If there were any discrepancies this would have been reported to her physician.

## 2017-05-17 IMAGING — MR MR CERVICAL SPINE W/O CM
4 of 5 series · 21 of 48 positions shown · non-contrast
Comparison: Cervical spine radiographs 02/14/2016 and CT 03/27/2012

CLINICAL DATA: Cervicalgia with radicular pain in both arms, left
greater than right for several months.

EXAM:
MRI CERVICAL SPINE WITHOUT CONTRAST
TECHNIQUE: Multiplanar, multisequence MR imaging of the cervical spine was
performed. No intravenous contrast was administered.

[Series 2: T2 · sagittal · 3.0mm · 0.39mm/px · 8 of 13 slices shown (1 of 3)]
[im 1/13]
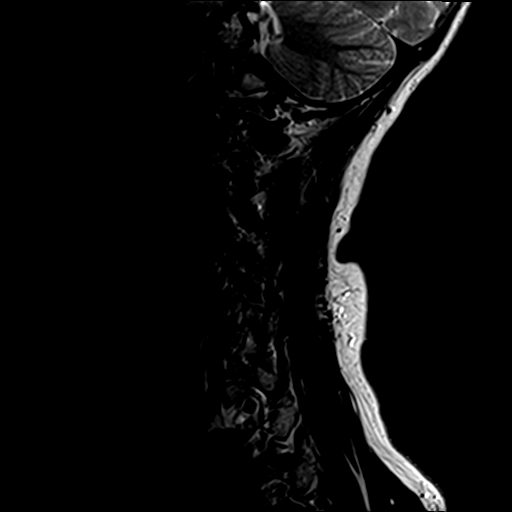
[im 2/13]
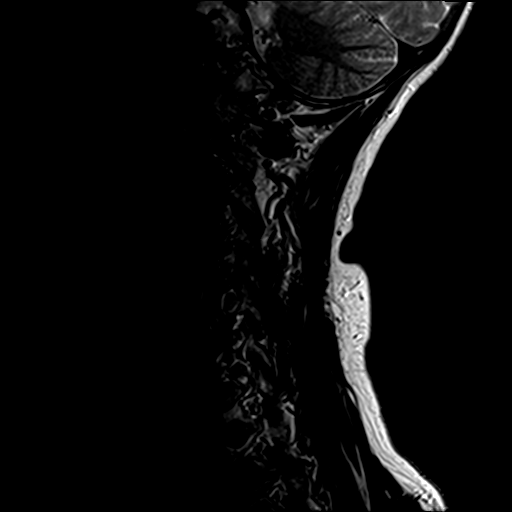
[im 4/13]
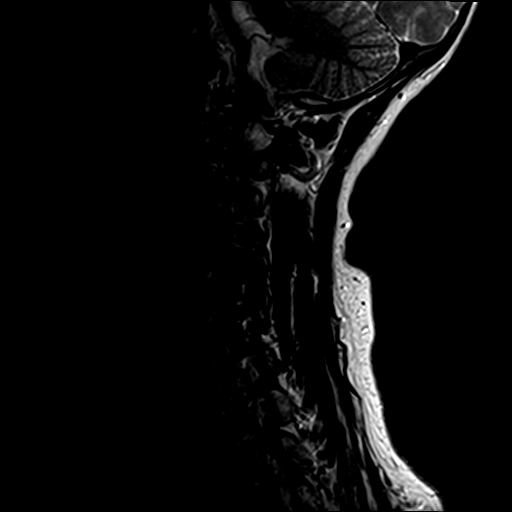
[im 6/13]
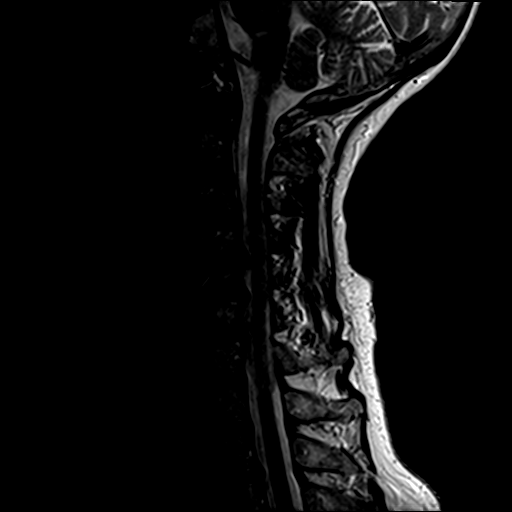
[im 7/13]
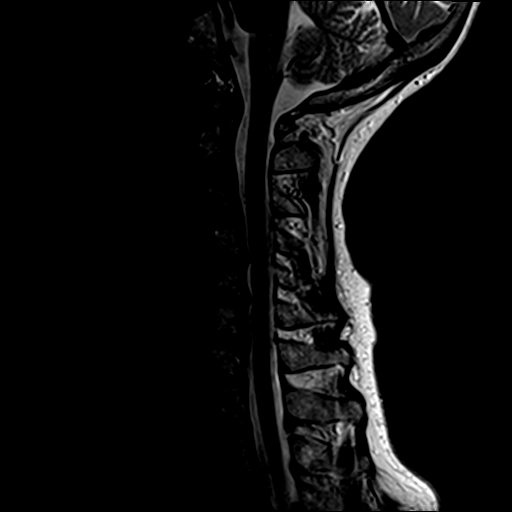
[im 9/13]
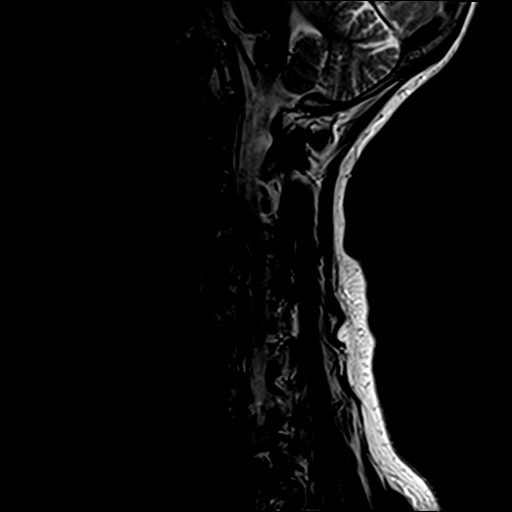
[im 11/13]
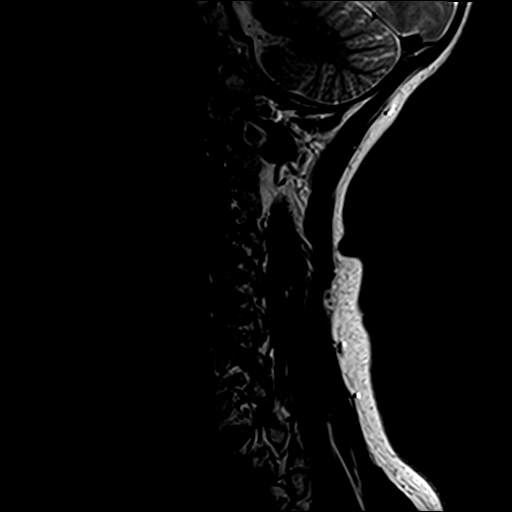
[im 13/13]
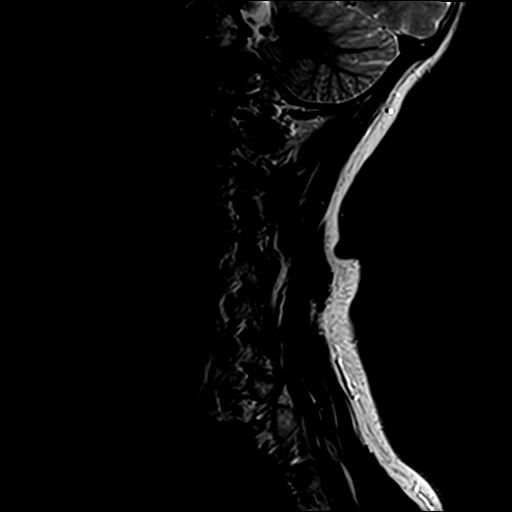

[Series 3: T1 · sagittal · 3.0mm · 0.39mm/px · 3 of 13 slices shown]
[im 3/13]
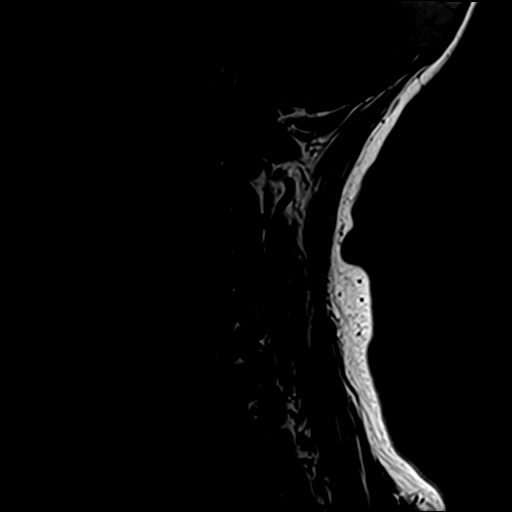
[im 7/13]
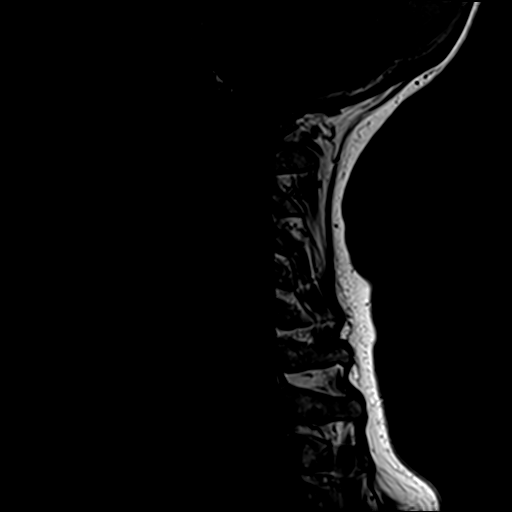
[im 11/13]
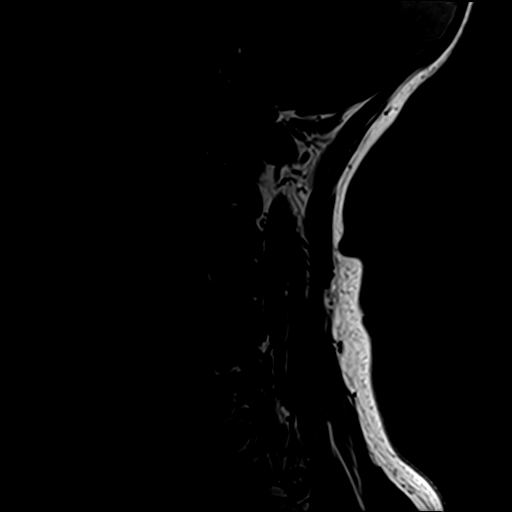

[Series 5: T2 · axial · 3.0mm · 0.39mm/px · z∈[-80,-10]mm · 7 of 24 slices shown (2 of 3)]
[im 1/24]
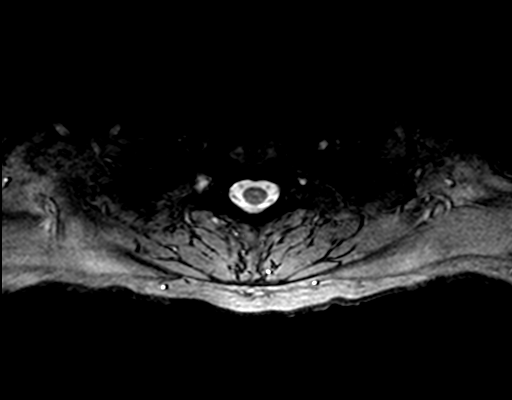
[im 4/24]
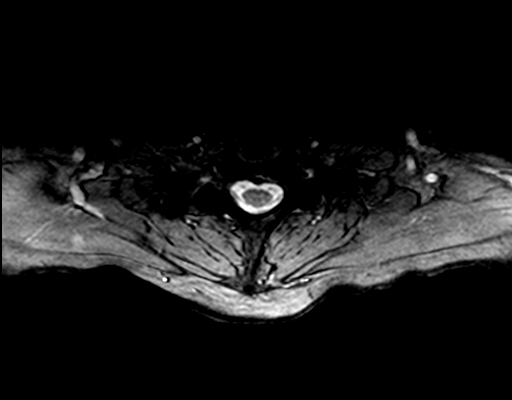
[im 8/24]
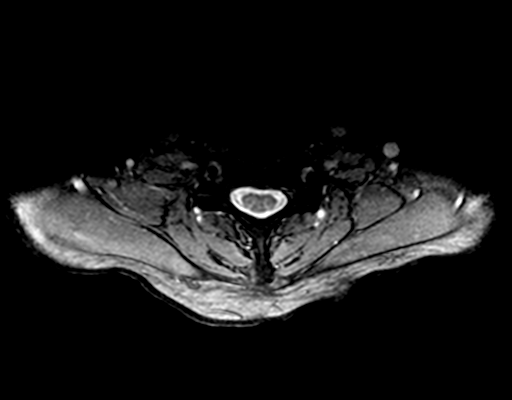
[im 10/24]
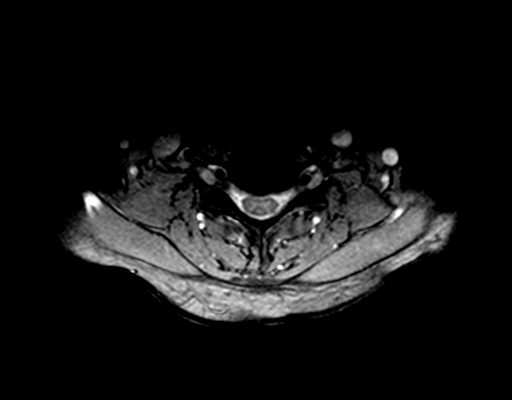
[im 12/24]
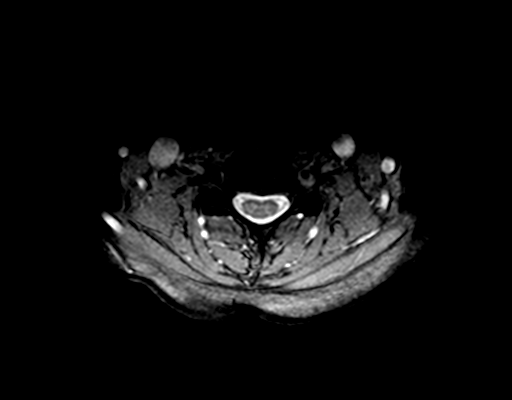
[im 14/24]
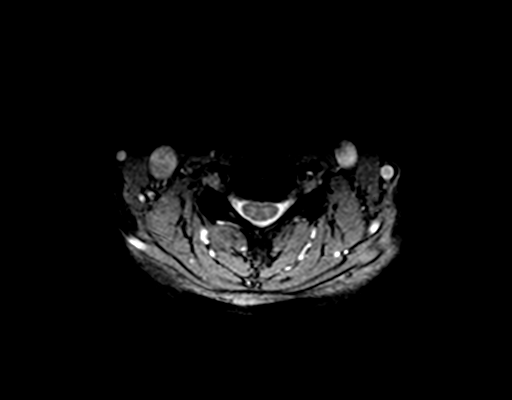
[im 20/24]
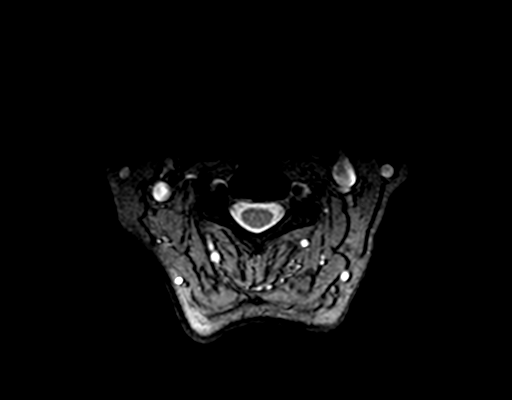

[Series 6: T2 · axial · 3.0mm · 0.39mm/px · z∈[-69,-11]mm · 3 of 24 slices shown (3 of 3)]
[im 4/24]
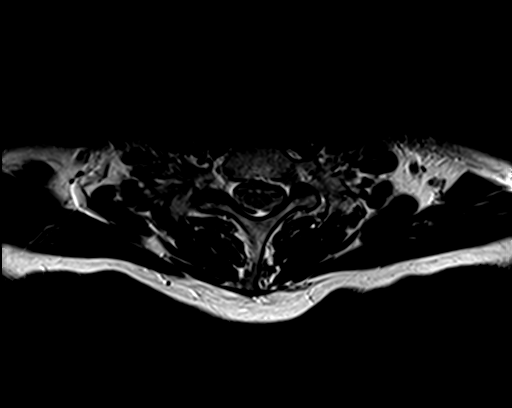
[im 12/24]
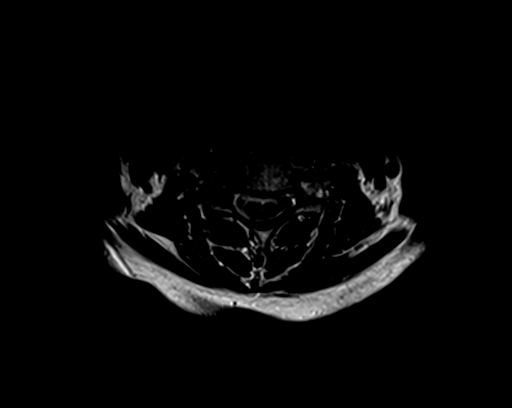
[im 20/24]
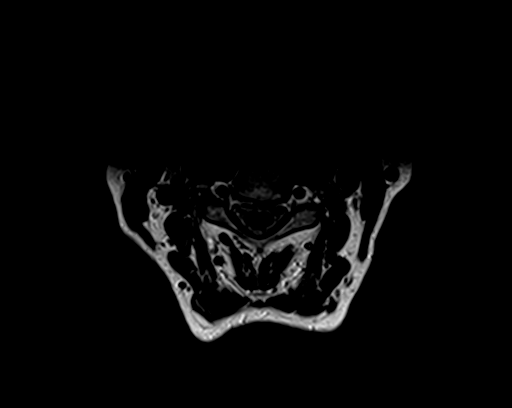

[21 of 48 positions shown; findings below may reference images not displayed]

FINDINGS: Alignment: Cervical spine straightening.  No significant listhesis.

Vertebrae: Preserved vertebral body heights without evidence of
fracture. Moderate disc space narrowing with associated mild
degenerative endplate edema from C4-5 to C6-7.

Cord: Normal signal and morphology.

Posterior Fossa, vertebral arteries, paraspinal tissues:
Unremarkable.

Disc levels:

C2-3: Minimal left facet arthrosis without disc herniation or
stenosis.

C3-4: Minimal uncovertebral spurring and minimal facet arthrosis
without stenosis.

C4-5: Broad-based posterior disc osteophyte complex results in mild
bilateral neural foraminal stenosis and flattening of the ventral
thecal sac without significant spinal stenosis.

C5-6: Broad-based posterior disc osteophyte complex results in mild
bilateral neural foraminal stenosis. There is a more focal right
central disc protrusion without significant spinal stenosis.

C6-7: Left paracentral disc protrusion without significant spinal
stenosis. Uncovertebral hypertrophy results in mild left greater
than right neural foraminal stenosis.

C7-T1:  Tiny central disc protrusion without stenosis.

T2-3: Only imaged sagittally. Small central disc protrusion and mild
facet arthrosis without stenosis.
IMPRESSION: C4-5 to C6-7 disc degeneration with mild neural foraminal stenosis.
No significant spinal stenosis.

## 2017-05-18 ENCOUNTER — Other Ambulatory Visit: Payer: Self-pay

## 2017-05-18 DIAGNOSIS — F908 Attention-deficit hyperactivity disorder, other type: Secondary | ICD-10-CM

## 2017-05-18 DIAGNOSIS — G43009 Migraine without aura, not intractable, without status migrainosus: Secondary | ICD-10-CM

## 2017-05-18 DIAGNOSIS — H8113 Benign paroxysmal vertigo, bilateral: Secondary | ICD-10-CM

## 2017-05-18 MED ORDER — TRAMADOL HCL 50 MG PO TABS: ORAL_TABLET | ORAL | 0 refills | Status: DC

## 2017-05-18 MED ORDER — METHYLPHENIDATE HCL 20 MG PO TABS: ORAL_TABLET | ORAL | 0 refills | Status: DC

## 2017-05-18 NOTE — Telephone Encounter (Signed)
Rxs printed for Dr Riley KillSwartz to sign on Monday.

## 2017-05-18 NOTE — Telephone Encounter (Signed)
Patient called needing a refill on ritalin and gabapentin and said that her husband could pick it up Tuesday 05/22/17.

## 2017-06-11 ENCOUNTER — Telehealth: Payer: Self-pay | Admitting: *Deleted

## 2017-06-11 NOTE — Telephone Encounter (Signed)
Jenna Stafford called from Jenna Stafford and she has injured her leg-possible tibial plateau fx - and they willnot prescribe without verbal instructions from Jenna Stafford.  I called and spoke to Jenna Sherene SiresWainer's PA Jenna Stafford, and Jenna Stafford ok'd for her to take hydrocodone 5/325 1-2 q 4-6 hours prn instead of her tramadol.  They will disp #60. She is having an MRI and they will know more after that as to exactly what the issue is with her leg. Jenna Stafford also has an appt here with Jenna Stafford on Thursday.

## 2017-06-14 ENCOUNTER — Encounter: Payer: 59 | Attending: Physical Medicine & Rehabilitation | Admitting: Registered Nurse

## 2017-06-14 ENCOUNTER — Encounter: Payer: Self-pay | Admitting: Registered Nurse

## 2017-06-14 VITALS — BP 120/71 | HR 78

## 2017-06-14 DIAGNOSIS — S065X0A Traumatic subdural hemorrhage without loss of consciousness, initial encounter: Secondary | ICD-10-CM

## 2017-06-14 DIAGNOSIS — R200 Anesthesia of skin: Secondary | ICD-10-CM | POA: Diagnosis not present

## 2017-06-14 DIAGNOSIS — M79602 Pain in left arm: Secondary | ICD-10-CM | POA: Diagnosis not present

## 2017-06-14 DIAGNOSIS — F908 Attention-deficit hyperactivity disorder, other type: Secondary | ICD-10-CM

## 2017-06-14 DIAGNOSIS — Z87891 Personal history of nicotine dependence: Secondary | ICD-10-CM | POA: Insufficient documentation

## 2017-06-14 DIAGNOSIS — G43109 Migraine with aura, not intractable, without status migrainosus: Secondary | ICD-10-CM | POA: Diagnosis not present

## 2017-06-14 DIAGNOSIS — M25561 Pain in right knee: Secondary | ICD-10-CM

## 2017-06-14 DIAGNOSIS — G43001 Migraine without aura, not intractable, with status migrainosus: Secondary | ICD-10-CM

## 2017-06-14 DIAGNOSIS — Z5181 Encounter for therapeutic drug level monitoring: Secondary | ICD-10-CM

## 2017-06-14 DIAGNOSIS — M79601 Pain in right arm: Secondary | ICD-10-CM | POA: Insufficient documentation

## 2017-06-14 DIAGNOSIS — F329 Major depressive disorder, single episode, unspecified: Secondary | ICD-10-CM | POA: Insufficient documentation

## 2017-06-14 DIAGNOSIS — S065X9S Traumatic subdural hemorrhage with loss of consciousness of unspecified duration, sequela: Secondary | ICD-10-CM | POA: Insufficient documentation

## 2017-06-14 DIAGNOSIS — Z9884 Bariatric surgery status: Secondary | ICD-10-CM | POA: Insufficient documentation

## 2017-06-14 DIAGNOSIS — Z79899 Other long term (current) drug therapy: Secondary | ICD-10-CM | POA: Diagnosis not present

## 2017-06-14 DIAGNOSIS — S065X1S Traumatic subdural hemorrhage with loss of consciousness of 30 minutes or less, sequela: Secondary | ICD-10-CM

## 2017-06-14 MED ORDER — METHYLPHENIDATE HCL 20 MG PO TABS: ORAL_TABLET | ORAL | 0 refills | Status: DC

## 2017-06-14 NOTE — Progress Notes (Signed)
Subjective:    Patient ID: Jenna Stafford, female    DOB: 11/18/1955, 61 y.o.   MRN: [id number]  HPI: Ms. Jenna DachRebecca Dartt is a 61 year old female who returns for follow up appointment and medication refill. Ms. Val EagleLemons states her pain is located in her right knee. She rates her pain 3. Her current exercise regime is walking.  Ms. Val EagleLemons reports she had a fall on Sunday 06/10/2017 ,she was standing on a chair hanging christmas decorations on her christmas tree, when she lost her balanced and landed on her right hip and right knee. She was able to pick herself up, she seen her orthopedist ( Dr. Thurston HoleWainer) on Monday 06/11/2017.   Ms. Val EagleLemons continues with AAA meetings , also reports she is going daily.   Ms. Val EagleLemons Morphine equivalent is 60 MME, while on Hydrocodone.   Pain Inventory Average Pain 8 Pain Right Now 3 My pain is dull and aching  In the last 24 hours, has pain interfered with the following? General activity 0 Relation with others 0 Enjoyment of life 0 What TIME of day is your pain at its worst? morning, night Sleep (in general) Good  Pain is worse with: walking, bending and standing Pain improves with: rest and medication Relief from Meds: 9  Mobility walk without assistance how many minutes can you walk? unlimited ability to climb steps?  yes do you drive?  yes Do you have any goals in this area?  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  no  Neuro/Psych bladder control problems numbness dizziness  Prior Studies Any changes since last visit?  no  Physicians involved in your care Any changes since last visit?  no   Family History  Problem Relation Age of Onset  . Heart disease Mother   . COPD Mother   . Other Father   . Other Brother    Social History   Socioeconomic History  . Marital status: Married    Spouse name: Not on file  . Number of children: Not on file  . Years of education: Not  on file  . Highest education level: Not on file  Social Needs  . Financial resource strain: Not on file  . Food insecurity - worry: Not on file  . Food insecurity - inability: Not on file  . Transportation needs - medical: Not on file  . Transportation needs - non-medical: Not on file  Occupational History  . Not on file  Tobacco Use  . Smoking status: Former Smoker    Packs/day: 0.50    Years: 1.00    Pack years: 0.50    Types: Cigarettes    Last attempt to quit: 08/02/2012    Years since quitting: 4.8  . Smokeless tobacco: Never Used  Substance and Sexual Activity  . Alcohol use: No  . Drug use: No  . Sexual activity: Not on file  Other Topics Concern  . Not on file  Social History Narrative  . Not on file   Past Surgical History:  Procedure Laterality Date  . ABDOMINAL HYSTERECTOMY    . APPENDECTOMY    . ARTHROSCOPIC REPAIR ACL     right side  . CESAREAN SECTION    . GASTRIC BYPASS    . KNEE SURGERY     left   Past Medical History:  Diagnosis Date  . Depression    There were no vitals taken for  this visit.  Opioid Risk Score:   Fall Risk Score:  `1  Depression screen PHQ 2/9  [facility] 02/10/2015 02/10/2015  Decreased Interest 0 0  Down, Depressed, Hopeless 0 0  PHQ - 2 Score 0 0  Altered sleeping 0 -  Tired, decreased energy 0 -  Change in appetite 0 -  Feeling bad or failure about yourself  0 -  Trouble concentrating 0 -  Moving slowly or fidgety/restless 0 -  Suicidal thoughts 0 -  PHQ-9 Score 0 -    Review of Systems  Constitutional: Negative.   HENT: Positive for dental problem.   Eyes: Negative.   Respiratory: Negative.   Cardiovascular: Negative.   Gastrointestinal: Negative.   Endocrine: Negative.   Genitourinary: Negative.   Musculoskeletal: Negative.   Allergic/Immunologic: Negative.   Neurological: Positive for dizziness, numbness and headaches.  Hematological: Negative.   Psychiatric/Behavioral: Negative.         Objective:   Physical Exam  Constitutional: She is oriented to person, place, and time. She appears well-developed and well-nourished.  HENT:  Head: Normocephalic and atraumatic.  Neck: Normal range of motion. Neck supple.  Cardiovascular: Normal rate and regular rhythm.  Pulmonary/Chest: Effort normal and breath sounds normal.  Musculoskeletal:  Norma;l Muscle Bulk and Muscle Testing Reveals: Upper Extremities: Full  ROM and Muscle Strength 5/5 Lower Extremities: Right: Decreased ROM and Muscle Strength 4/5 wearing right knee immobilizer Left: Full ROM and Muscle Strength 5/5 Arises from Table with ease Narrow Based Gait  Neurological: She is alert and oriented to person, place, and time.  Skin: Skin is warm and dry.  Psychiatric: She has a normal mood and affect.  Nursing note and vitals reviewed.         Assessment & Plan:  1. 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 06/14/2017 2. BPPV related to the above: No complaints today. 06/14/2017 3. 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 Tramadol on hold while prescribe on Hydrocodone11/29/2018 4. Memory issues likely related to activity levels, attention, sleep, etc. Continue Ritalin 20 mg 1.5 tablets in am and one tablet at noon. twice a day #75. Second script given for thje following month. 5. Right Knee Pain/ S/P Fall: Orthopedist Following/ Continue Hydrocodone  20 minutes of face to face patient care time was spent during this visit. All questions were encouraged and answered.   F/U in 2 months

## 2017-07-02 ENCOUNTER — Other Ambulatory Visit: Payer: Self-pay | Admitting: *Deleted

## 2017-07-02 DIAGNOSIS — G43009 Migraine without aura, not intractable, without status migrainosus: Secondary | ICD-10-CM

## 2017-07-02 DIAGNOSIS — H8113 Benign paroxysmal vertigo, bilateral: Secondary | ICD-10-CM

## 2017-07-02 DIAGNOSIS — F908 Attention-deficit hyperactivity disorder, other type: Secondary | ICD-10-CM

## 2017-07-02 MED ORDER — TRAMADOL HCL 50 MG PO TABS: ORAL_TABLET | ORAL | 0 refills | Status: DC

## 2017-08-07 ENCOUNTER — Other Ambulatory Visit: Payer: Self-pay | Admitting: Physical Medicine & Rehabilitation

## 2017-08-07 DIAGNOSIS — H8113 Benign paroxysmal vertigo, bilateral: Secondary | ICD-10-CM

## 2017-08-07 DIAGNOSIS — G43009 Migraine without aura, not intractable, without status migrainosus: Secondary | ICD-10-CM

## 2017-08-07 DIAGNOSIS — F908 Attention-deficit hyperactivity disorder, other type: Secondary | ICD-10-CM

## 2017-08-14 ENCOUNTER — Ambulatory Visit: Payer: 59 | Admitting: Registered Nurse

## 2017-08-20 ENCOUNTER — Encounter: Payer: 59 | Attending: Physical Medicine & Rehabilitation | Admitting: Registered Nurse

## 2017-08-20 ENCOUNTER — Other Ambulatory Visit: Payer: Self-pay

## 2017-08-20 ENCOUNTER — Encounter: Payer: Self-pay | Admitting: Registered Nurse

## 2017-08-20 VITALS — BP 109/68 | HR 77

## 2017-08-20 DIAGNOSIS — Z5181 Encounter for therapeutic drug level monitoring: Secondary | ICD-10-CM | POA: Diagnosis not present

## 2017-08-20 DIAGNOSIS — S065X9S Traumatic subdural hemorrhage with loss of consciousness of unspecified duration, sequela: Secondary | ICD-10-CM | POA: Diagnosis present

## 2017-08-20 DIAGNOSIS — Z87820 Personal history of traumatic brain injury: Secondary | ICD-10-CM | POA: Diagnosis not present

## 2017-08-20 DIAGNOSIS — Z79899 Other long term (current) drug therapy: Secondary | ICD-10-CM | POA: Diagnosis not present

## 2017-08-20 DIAGNOSIS — M5412 Radiculopathy, cervical region: Secondary | ICD-10-CM

## 2017-08-20 DIAGNOSIS — F908 Attention-deficit hyperactivity disorder, other type: Secondary | ICD-10-CM | POA: Diagnosis not present

## 2017-08-20 DIAGNOSIS — M79601 Pain in right arm: Secondary | ICD-10-CM | POA: Insufficient documentation

## 2017-08-20 DIAGNOSIS — G43001 Migraine without aura, not intractable, with status migrainosus: Secondary | ICD-10-CM | POA: Diagnosis not present

## 2017-08-20 DIAGNOSIS — M542 Cervicalgia: Secondary | ICD-10-CM | POA: Diagnosis not present

## 2017-08-20 DIAGNOSIS — R200 Anesthesia of skin: Secondary | ICD-10-CM | POA: Diagnosis not present

## 2017-08-20 DIAGNOSIS — F329 Major depressive disorder, single episode, unspecified: Secondary | ICD-10-CM | POA: Insufficient documentation

## 2017-08-20 DIAGNOSIS — Z87891 Personal history of nicotine dependence: Secondary | ICD-10-CM | POA: Insufficient documentation

## 2017-08-20 DIAGNOSIS — Z9884 Bariatric surgery status: Secondary | ICD-10-CM | POA: Diagnosis not present

## 2017-08-20 DIAGNOSIS — M79602 Pain in left arm: Secondary | ICD-10-CM | POA: Insufficient documentation

## 2017-08-20 DIAGNOSIS — G43109 Migraine with aura, not intractable, without status migrainosus: Secondary | ICD-10-CM | POA: Diagnosis not present

## 2017-08-20 MED ORDER — TRAMADOL HCL 50 MG PO TABS
ORAL_TABLET | ORAL | 2 refills | Status: AC
Start: 2017-08-20 — End: ?

## 2017-08-20 MED ORDER — METHYLPHENIDATE HCL 20 MG PO TABS: ORAL_TABLET | ORAL | 0 refills | Status: DC

## 2017-08-20 NOTE — Progress Notes (Signed)
Subjective:    Patient ID: Jenna Stafford, female    DOB: 10/23/1955, 62 y.o.   MRN: [id number]  HPI: Jenna Stafford is a 62 year old female who returns for follow up appointment and medication refill. Jenna Stafford states her pain is located in her neck radiating into her right shoulder and bilateral hands with tingling. She rates her pain 4. Her current exercise regime is walking and performing stretching exercises.   Jenna Stafford continues with [employer] meetings , also reports she is going daily.   Jenna Stafford Morphine equivalent is 18.67 MME/30 days.  Pain Inventory Average Pain 9 Pain Right Now 4 My pain is dull, stabbing, tingling and aching  In the last 24 hours, has pain interfered with the following? General activity na Relation with others na Enjoyment of life na What TIME of day is your pain at its worst? morning, night Sleep (in general) Good  Pain is worse with: walking, bending, sitting and standing Pain improves with: rest and medication Relief from Meds: 9  Mobility walk without assistance how many minutes can you walk? unlimited ability to climb steps?  yes do you drive?  yes Do you have any goals in this area?  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  no  Neuro/Psych bladder control problems numbness dizziness  Prior Studies Any changes since last visit?  no  Physicians involved in your care Any changes since last visit?  no   Family History  Problem Relation Age of Onset  . Heart disease Mother   . COPD Mother   . Other Father   . Other Brother    Social History   Socioeconomic History  . Marital status: Married    Spouse name: None  . Number of children: None  . Years of education: None  . Highest education level: None  Social Needs  . Financial resource strain: None  . Food insecurity - worry: None  . Food insecurity - inability: None  . Transportation needs - medical: None    . Transportation needs - non-medical: None  Occupational History  . None  Tobacco Use  . Smoking status: Former Smoker    Packs/day: 0.50    Years: 1.00    Pack years: 0.50    Types: Cigarettes    Last attempt to quit: 08/02/2012    Years since quitting: 5.0  . Smokeless tobacco: Never Used  Substance and Sexual Activity  . Alcohol use: No  . Drug use: No  . Sexual activity: None  Other Topics Concern  . None  Social History Narrative  . None   Past Surgical History:  Procedure Laterality Date  . ABDOMINAL HYSTERECTOMY    . APPENDECTOMY    . ARTHROSCOPIC REPAIR ACL     right side  . CESAREAN SECTION    . GASTRIC BYPASS    . KNEE SURGERY     left   Past Medical History:  Diagnosis Date  . Depression    BP 109/68   Pulse 77   SpO2 97%   Opioid Risk Score:  12 Fall Risk Score:  `1  Depression screen PHQ 2/9  Dep[facility] 08/20/2017 02/10/2015 02/10/2015  Decreased Interest 1 0 0  Down, Depressed, Hopeless 1 0 0  PHQ - 2 Score 2 0 0  Altered sleeping - 0 -  Tired, decreased energy - 0 -  Change in appetite -  0 -  Feeling bad or failure about yourself  - 0 -  Trouble concentrating - 0 -  Moving slowly or fidgety/restless - 0 -  Suicidal thoughts - 0 -  PHQ-9 Score - 0 -    Review of Systems  Constitutional: Negative.   HENT: Positive for dental problem.   Eyes: Negative.   Respiratory: Negative.   Cardiovascular: Negative.   Gastrointestinal: Negative.   Endocrine: Negative.   Genitourinary:       Stress incont  Musculoskeletal: Negative.   Allergic/Immunologic: Negative.   Neurological: Positive for dizziness, numbness and headaches.  Hematological: Negative.   Psychiatric/Behavioral: Negative.        Objective:   Physical Exam  Constitutional: She is oriented to person, place, and time. She appears well-developed and well-nourished.  HENT:  Head: Normocephalic and atraumatic.  Neck: Normal range of motion. Neck supple.   Cardiovascular: Normal rate and regular rhythm.  Pulmonary/Chest: Effort normal and breath sounds normal.  Musculoskeletal:  Norma;l Muscle Bulk and Muscle Testing Reveals: Upper Extremities: Full ROM and Muscle Strength 5/5 Bilateral AC Joint Tenderness Thoracic Paraspinal Tenderness: T-1-T-3 Lower Extremities: Full ROM and Muscle Strength 5/5 Arises from Table with ease Narrow Based Gait  Neurological: She is alert and oriented to person, place, and time.  Skin: Skin is warm and dry.  Psychiatric: She has a normal mood and affect.  Nursing note and vitals reviewed.         Assessment & Plan:  1. 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 08/22/2017 2. BPPV related to the above: No complaints today. 08/22/2017 3. 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 08/22/2017 4. Memory issues likely related to activity levels, attention, sleep, etc. Continue Ritalin 20 mg 1.5 tablets in am and one tablet at noon. twice a day #75. Second script given for the following month. 08/22/2017 5. Cervicalgia/ Cervical Radiculitis: Continue to Monitor. 08/22/2017  20 minutes of face to face patient care time was spent during this visit. All questions were encouraged and answered.   F/U in 2 months

## 2017-10-15 ENCOUNTER — Encounter: Payer: 59 | Admitting: Physical Medicine & Rehabilitation

## 2017-10-16 ENCOUNTER — Ambulatory Visit: Payer: 59 | Admitting: Physical Medicine & Rehabilitation

## 2017-10-29 ENCOUNTER — Telehealth: Payer: Self-pay | Admitting: *Deleted

## 2017-10-29 DIAGNOSIS — F908 Attention-deficit hyperactivity disorder, other type: Secondary | ICD-10-CM

## 2017-10-29 MED ORDER — METHYLPHENIDATE HCL 20 MG PO TABS: ORAL_TABLET | ORAL | 0 refills | Status: DC

## 2017-10-29 NOTE — Telephone Encounter (Signed)
Methylphenidate e-scribe, PMP site reviewed. Last prescription picked up on 09/25/2017. Her scheduled appointment with Dr. Riley KillSwartz on 11/07/2017. Placed a call to Jenna Stafford regarding the above, she verbalizes understanding.

## 2017-10-29 NOTE — Telephone Encounter (Signed)
[name] called and stated she will be out of her methylphenidate before her 11/07/17 visit with Dr Riley KillSwartz. She will be out tomorrow.  Her last fill was 09/25/17.  Please send refill to [facility].

## 2017-11-07 ENCOUNTER — Encounter: Payer: 59 | Attending: Physical Medicine & Rehabilitation | Admitting: Physical Medicine & Rehabilitation

## 2017-11-07 ENCOUNTER — Other Ambulatory Visit: Payer: Self-pay

## 2017-11-07 ENCOUNTER — Encounter: Payer: Self-pay | Admitting: Physical Medicine & Rehabilitation

## 2017-11-07 VITALS — BP 143/82 | HR 95 | Ht 64.0 in | Wt 131.0 lb

## 2017-11-07 DIAGNOSIS — Z9884 Bariatric surgery status: Secondary | ICD-10-CM | POA: Insufficient documentation

## 2017-11-07 DIAGNOSIS — G43109 Migraine with aura, not intractable, without status migrainosus: Secondary | ICD-10-CM | POA: Diagnosis not present

## 2017-11-07 DIAGNOSIS — S065X9S Traumatic subdural hemorrhage with loss of consciousness of unspecified duration, sequela: Secondary | ICD-10-CM | POA: Insufficient documentation

## 2017-11-07 DIAGNOSIS — F329 Major depressive disorder, single episode, unspecified: Secondary | ICD-10-CM | POA: Insufficient documentation

## 2017-11-07 DIAGNOSIS — G43001 Migraine without aura, not intractable, with status migrainosus: Secondary | ICD-10-CM | POA: Diagnosis not present

## 2017-11-07 DIAGNOSIS — Z79891 Long term (current) use of opiate analgesic: Secondary | ICD-10-CM

## 2017-11-07 DIAGNOSIS — F908 Attention-deficit hyperactivity disorder, other type: Secondary | ICD-10-CM

## 2017-11-07 DIAGNOSIS — Z87891 Personal history of nicotine dependence: Secondary | ICD-10-CM | POA: Diagnosis not present

## 2017-11-07 DIAGNOSIS — Z87820 Personal history of traumatic brain injury: Secondary | ICD-10-CM | POA: Diagnosis not present

## 2017-11-07 DIAGNOSIS — M79602 Pain in left arm: Secondary | ICD-10-CM | POA: Insufficient documentation

## 2017-11-07 DIAGNOSIS — Z5181 Encounter for therapeutic drug level monitoring: Secondary | ICD-10-CM

## 2017-11-07 DIAGNOSIS — G894 Chronic pain syndrome: Secondary | ICD-10-CM

## 2017-11-07 DIAGNOSIS — M79601 Pain in right arm: Secondary | ICD-10-CM | POA: Insufficient documentation

## 2017-11-07 DIAGNOSIS — R200 Anesthesia of skin: Secondary | ICD-10-CM | POA: Insufficient documentation

## 2017-11-07 MED ORDER — METHYLPHENIDATE HCL 20 MG PO TABS: ORAL_TABLET | ORAL | 0 refills | Status: AC

## 2017-11-07 NOTE — Progress Notes (Signed)
Subjective:    Patient ID: Jenna Stafford, female    DOB: 1956-06-23, 62 y.o.   MRN: [id number]  HPI   Murrell is here in follow up of her postconcussion syndrome. She is feeling pretty well from cognitive and pain standpoint.   She just had a thrombosed hemorrhoid excised yesterday which is quite uncomfortable today.  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 She has had to do some driiving as part of the job.   Pain Inventory Average Pain 8 Pain Right Now 0 My pain is constant, dull and aching  In the last 24 hours, has pain interfered with the following? General activity n/a Relation with others n/a Enjoyment of life n/a What TIME of day is your pain at its worst? evening night Sleep (in general) Good  Pain is worse with: walking, bending and standing Pain improves with: rest and medication Relief from Meds: 10  Mobility walk without assistance how many minutes can you walk? unlimited ability to climb steps?  yes do you drive?  yes Do you have any goals in this area?  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  no  Neuro/Psych bladder control problems numbness tingling  Prior Studies Any changes since last visit?  no  Physicians involved in your care Any changes since last visit?  no   Family History  Problem Relation Age of Onset  . Heart disease Mother   . COPD Mother   . Other Father   . Other Brother    Social History   Socioeconomic History  . Marital status: Married    Spouse name: Not on file  . Number of children: Not on file  . Years of education: Not on file  . Highest education level: Not on file  Occupational History  . Not on file  Social Needs  . Financial resource strain: Not on file  . Food insecurity:    Worry: Not on file    Inability: Not on file  . Transportation needs:    Medical: Not on file    Non-medical: Not on file  Tobacco Use  . Smoking  status: Former Smoker    Packs/day: 0.50    Years: 1.00    Pack years: 0.50    Types: Cigarettes    Last attempt to quit: 08/02/2012    Years since quitting: 5.2  . Smokeless tobacco: Never Used  Substance and Sexual Activity  . Alcohol use: No  . Drug use: No  . Sexual activity: Not on file  Lifestyle  . Physical activity:    Days per week: Not on file    Minutes per session: Not on file  . Stress: Not on file  Relationships  . Social connections:    Talks on phone: Not on file    Gets together: Not on file    Attends religious service: Not on file    Active member of club or organization: Not on file    Attends meetings of clubs or organizations: Not on file    Relationship status: Not on file  Other Topics Concern  . Not on file  Social History Narrative  . Not on file   Past Surgical History:  Procedure Laterality Date  . ABDOMINAL HYSTERECTOMY    . APPENDECTOMY    . ARTHROSCOPIC REPAIR ACL     right side  . CESAREAN SECTION    .  GASTRIC BYPASS    . KNEE SURGERY     left   Past Medical History:  Diagnosis Date  . Depression    Ht 5\' 4"  (1.626 m) Comment: pt reported  Wt 131 lb (59.4 kg)   BMI 22.49 kg/m   Opioid Risk Score:   Fall Risk Score:  `1  Depression screen PHQ 2/9  [facility] 11/07/2017 08/20/2017 02/10/2015 02/10/2015  Decreased Interest 0 1 0 0  Down, Depressed, Hopeless 0 1 0 0  PHQ - 2 Score 0 2 0 0  Altered sleeping - - 0 -  Tired, decreased energy - - 0 -  Change in appetite - - 0 -  Feeling bad or failure about yourself  - - 0 -  Trouble concentrating - - 0 -  Moving slowly or fidgety/restless - - 0 -  Suicidal thoughts - - 0 -  PHQ-9 Score - - 0 -    Review of Systems  Constitutional: Negative.   HENT: Negative.   Eyes: Negative.   Respiratory: Negative.   Cardiovascular: Negative.   Gastrointestinal: Negative.        Hemorrhoids  Endocrine: Negative.   Genitourinary: Negative.   Musculoskeletal: Negative.     Skin: Negative.   Allergic/Immunologic: Negative.   Neurological: Negative.   Hematological: Negative.   Psychiatric/Behavioral: Negative.   All other systems reviewed and are negative.      Objective:   Physical Exam  General: No acute distress HEENT: EOMI, oral membranes moist Cards: reg rate  Chest: normal effort Abdomen: Soft, NT, ND Skin: dry, intact Extremities: no edema Skin: dry, intact Extremities: no edema Skin:Clean and intact without signs of breakdown  Neuro:normal motor and sensory. Functional memory and attention Musculoskeletal: sitting to right due to rectal pain Psych:pleasant, appropriate   Assessment & Plan:  1. 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  2. BPPV related to the above  3. 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 Has stress and sinus contributions as well.  4. Memory issues likely related to activity levels, attention, sleep, etc. Family history of dementia.  5. Low back strain--resolved 6. Bilateral arm pain and numbness. Mild radiculitis vs neuropraxic irritation from sleeping positions 7. ETOH abuse   Plan:  1. Maxalt prn for migraines has been effective.   2. Recent lower cervical pain/radicular symptoms--intermittent -HEP to continue 3. Continue tramadol for breakthrough headaches, generalized pain.  4. Continueritalin immediate release, 20mg  Bid #75 to allow 30mg  in AM.     -RF provided for next month.   -We will continue the controlled substance monitoring program, this consists of regular clinic visits, examinations, routine drug screening, pill counts as well as use of [place]. [place].    -UDS today 5. She'll continue with her AAA and church counselor. She was asked to call if needed if in distress of any kind.  6. I will have her see our NP  4 months. I spent 15 minutes today. All questions were encouraged and answered. She may call for next ritalin rx'es

## 2017-11-13 LAB — TOXASSURE SELECT,+ANTIDEPR,UR

## 2017-11-13 LAB — 6-ACETYLMORPHINE,TOXASSURE ADD
6-ACETYLMORPHINE: NEGATIVE
6-acetylmorphine: NOT DETECTED ng/mg creat

## 2017-11-14 ENCOUNTER — Telehealth: Payer: Self-pay | Admitting: *Deleted

## 2017-11-14 NOTE — Telephone Encounter (Signed)
Left message for Jenna Stafford to call me back.

## 2017-11-14 NOTE — Telephone Encounter (Signed)
Urine drug screen is inconsistent.  There is unprescribed morphine in her urine.  PMP aware checked and this has not been prescribed.  She did not report taking any Morphine to the CMA when asked about medications. If this was something given during her procedure I would have expected to see metabolites but not the parent drug, and she would need to supply records of the medications given during the procedure.  This is not the first time she has had unprescribed medication in her urine screen. Please advise

## 2017-11-14 NOTE — Telephone Encounter (Signed)
Please notify Jenna Stafford and ask where this might have come from.

## 2017-11-15 NOTE — Telephone Encounter (Signed)
Jenna Stafford called back.  She says it is from the procedure she had the day before her visit.  I have asked her to get the records documenting that they gave her morphine and she agrees.  She says it will probably be next week but she will fax them to [place].

## 2017-11-19 ENCOUNTER — Telehealth: Payer: Self-pay | Admitting: *Deleted

## 2017-11-19 NOTE — Telephone Encounter (Signed)
Ms Jenna Stafford left a message requesting to come by and speak with Dr Riley Kill on Thursday when she has an appt in [place].  I called her back and let her know that he is not in the office on Thursdays and our scheduling does not permit for time for a pt to drop by to speak with the MD.  She said she will make an appt to see him.  She did not say what this was in reference to, (but I had spoken with her on Thursday about the unprescribed medication in her UDS and requested she bring records of her procedure to show evidence that it was given during procedure). She also reported that she has a broken tooth and is having an appt to have it treated.  They have put her on clinidamycin and given her Rx for  #10 Tylenol #3

## 2017-11-21 NOTE — Telephone Encounter (Signed)
If she has proof of medication been being administered during the procedure which would explain her urine drug screen, then there is no need for her to make an appointment with me to discuss.

## 2017-11-22 NOTE — Telephone Encounter (Signed)
Previous phone call indicates that patient agreed to provide proof of procedure medication at next visit.

## 2017-11-26 NOTE — Telephone Encounter (Signed)
Proof was supposed to be supplied last week.  We have not received anything so far.

## 2017-12-28 ENCOUNTER — Telehealth: Payer: Self-pay | Admitting: *Deleted

## 2017-12-28 NOTE — Telephone Encounter (Signed)
Jenna JoinerRebecca called for a refill on her methylphenidate and tramadol. I have called her back and informed her that she will have to have an appt with Dr Riley KillSwartz to get her medication because she was supposed to supply proof that the unprescribed morphine sulfate in her last UDS was from a procedure. She did not supply us with that information. Due to having previous inconsistent UDS with unprescribed medication and ETOH,  she must bring or fax this information.. She says she will call for an appointment.

## 2018-03-06 ENCOUNTER — Encounter: Payer: 59 | Admitting: Physical Medicine & Rehabilitation

## 2018-03-25 ENCOUNTER — Other Ambulatory Visit: Payer: Self-pay | Admitting: Registered Nurse

## 2018-03-25 ENCOUNTER — Encounter: Payer: 59 | Admitting: Physical Medicine & Rehabilitation

## 2018-03-25 DIAGNOSIS — F908 Attention-deficit hyperactivity disorder, other type: Secondary | ICD-10-CM

## 2018-03-25 NOTE — Telephone Encounter (Signed)
Patient cancelled appointment due to illness. She was educated by our Nurse that she needs to fulfill an appointment before anymore refills due toi inconsistencies in her UDS. Electronic refill request was sent to [facility] for review
